# Patient Record
Sex: Male | Born: 1945 | Race: White | Hispanic: No | Marital: Married | Smoking: Former smoker
Health system: Southern US, Community
[De-identification: ages and names within clinical notes are randomized; demographics above are authoritative.]

## PROBLEM LIST (undated history)

## (undated) DIAGNOSIS — K219 Gastro-esophageal reflux disease without esophagitis: Secondary | ICD-10-CM

## (undated) DIAGNOSIS — R350 Frequency of micturition: Secondary | ICD-10-CM

## (undated) DIAGNOSIS — I890 Lymphedema, not elsewhere classified: Secondary | ICD-10-CM

## (undated) DIAGNOSIS — I878 Other specified disorders of veins: Secondary | ICD-10-CM

## (undated) DIAGNOSIS — R42 Dizziness and giddiness: Secondary | ICD-10-CM

## (undated) DIAGNOSIS — K625 Hemorrhage of anus and rectum: Secondary | ICD-10-CM

## (undated) DIAGNOSIS — E785 Hyperlipidemia, unspecified: Secondary | ICD-10-CM

## (undated) DIAGNOSIS — Z972 Presence of dental prosthetic device (complete) (partial): Secondary | ICD-10-CM

## (undated) DIAGNOSIS — A6000 Herpesviral infection of urogenital system, unspecified: Secondary | ICD-10-CM

## (undated) DIAGNOSIS — IMO0001 Reserved for inherently not codable concepts without codable children: Secondary | ICD-10-CM

## (undated) DIAGNOSIS — M1990 Unspecified osteoarthritis, unspecified site: Secondary | ICD-10-CM

## (undated) DIAGNOSIS — G4730 Sleep apnea, unspecified: Secondary | ICD-10-CM

## (undated) DIAGNOSIS — Z789 Other specified health status: Secondary | ICD-10-CM

## (undated) DIAGNOSIS — R972 Elevated prostate specific antigen [PSA]: Secondary | ICD-10-CM

## (undated) DIAGNOSIS — N400 Enlarged prostate without lower urinary tract symptoms: Secondary | ICD-10-CM

## (undated) DIAGNOSIS — I10 Essential (primary) hypertension: Secondary | ICD-10-CM

## (undated) DIAGNOSIS — F419 Anxiety disorder, unspecified: Secondary | ICD-10-CM

## (undated) DIAGNOSIS — Z9889 Other specified postprocedural states: Secondary | ICD-10-CM

## (undated) HISTORY — DX: Hyperlipidemia, unspecified: E78.5

## (undated) HISTORY — DX: Other specified disorders of veins: I87.8

## (undated) HISTORY — DX: Benign prostatic hyperplasia without lower urinary tract symptoms: N40.0

## (undated) HISTORY — DX: Sleep apnea, unspecified: G47.30

## (undated) HISTORY — DX: Elevated prostate specific antigen (PSA): R97.20

## (undated) HISTORY — DX: Herpesviral infection of urogenital system, unspecified: A60.00

## (undated) HISTORY — DX: Gastro-esophageal reflux disease without esophagitis: K21.9

## (undated) HISTORY — DX: Other specified postprocedural states: Z98.890

## (undated) HISTORY — DX: Lymphedema, not elsewhere classified: I89.0

## (undated) HISTORY — PX: VEIN SURGERY: SHX48

## (undated) HISTORY — DX: Unspecified osteoarthritis, unspecified site: M19.90

## (undated) HISTORY — DX: Hemorrhage of anus and rectum: K62.5

## (undated) HISTORY — DX: Frequency of micturition: R35.0

## (undated) HISTORY — DX: Essential (primary) hypertension: I10

## (undated) HISTORY — PX: COLONOSCOPY: SHX174

---

## 2000-05-19 DIAGNOSIS — Z9889 Other specified postprocedural states: Secondary | ICD-10-CM

## 2000-05-19 HISTORY — DX: Other specified postprocedural states: Z98.890

## 2002-05-19 HISTORY — PX: LIPOMA EXCISION: SHX5283

## 2006-03-17 ENCOUNTER — Encounter
Admission: RE | Admit: 2006-03-17 | Discharge: 2006-03-17 | Payer: Self-pay | Admitting: Physical Medicine and Rehabilitation

## 2008-02-09 ENCOUNTER — Emergency Department (HOSPITAL_COMMUNITY): Admission: EM | Admit: 2008-02-09 | Discharge: 2008-02-09 | Payer: Self-pay | Admitting: Emergency Medicine

## 2008-03-06 ENCOUNTER — Ambulatory Visit: Payer: Self-pay | Admitting: Family Medicine

## 2008-10-12 ENCOUNTER — Encounter: Payer: Self-pay | Admitting: Internal Medicine

## 2008-10-17 ENCOUNTER — Encounter: Payer: Self-pay | Admitting: Internal Medicine

## 2009-07-04 ENCOUNTER — Ambulatory Visit: Payer: Self-pay | Admitting: Gastroenterology

## 2010-10-31 ENCOUNTER — Encounter (HOSPITAL_BASED_OUTPATIENT_CLINIC_OR_DEPARTMENT_OTHER): Payer: Medicare Other | Attending: Internal Medicine

## 2010-10-31 DIAGNOSIS — IMO0001 Reserved for inherently not codable concepts without codable children: Secondary | ICD-10-CM | POA: Insufficient documentation

## 2010-10-31 DIAGNOSIS — E7800 Pure hypercholesterolemia, unspecified: Secondary | ICD-10-CM | POA: Insufficient documentation

## 2010-10-31 DIAGNOSIS — L98499 Non-pressure chronic ulcer of skin of other sites with unspecified severity: Secondary | ICD-10-CM | POA: Insufficient documentation

## 2010-10-31 DIAGNOSIS — E119 Type 2 diabetes mellitus without complications: Secondary | ICD-10-CM | POA: Insufficient documentation

## 2010-10-31 DIAGNOSIS — K219 Gastro-esophageal reflux disease without esophagitis: Secondary | ICD-10-CM | POA: Insufficient documentation

## 2010-10-31 DIAGNOSIS — I739 Peripheral vascular disease, unspecified: Secondary | ICD-10-CM | POA: Insufficient documentation

## 2010-10-31 DIAGNOSIS — L988 Other specified disorders of the skin and subcutaneous tissue: Secondary | ICD-10-CM | POA: Insufficient documentation

## 2010-10-31 DIAGNOSIS — I872 Venous insufficiency (chronic) (peripheral): Secondary | ICD-10-CM | POA: Insufficient documentation

## 2010-10-31 DIAGNOSIS — I10 Essential (primary) hypertension: Secondary | ICD-10-CM | POA: Insufficient documentation

## 2010-10-31 DIAGNOSIS — I890 Lymphedema, not elsewhere classified: Secondary | ICD-10-CM | POA: Insufficient documentation

## 2010-11-01 NOTE — Assessment & Plan Note (Unsigned)
[facility]  NAME:  Brett Lam, Brett Lam NO.:  [id number]  MEDICAL RECORD NO.:  [id number]      DATE OF BIRTH:  Jun 13, 1945  PHYSICIAN:  Maxwell Caul, M.D. VISIT DATE:  10/31/2010                                  OFFICE VISIT   Brett Lam is a 65 year old man who is referred by his physician at the [facility] in [place] for our review of chronic skin changes on his bilateral buttocks, which have been painful.  The history is a bit difficult to obtain.  Actually the patient came in and started discussing his chronic venous stasis changes in his bilateral legs.  It was only when I looked at the referral sheet that I saw the reason that he was actually here and I was already part way through my review.  In any case, he has had on his bilateral buttocks a painful area for several years.  His doctor notes that these are chronic lymphedematous/elephantiasis skin changes.  We looked at that these issues.  He has not had any surgery in the area.  He has not had any infections.  He does not have any recent rectal bleeding, although I note he had a flex sig in 2002 for rectal bleeding and had polyps on a colonoscopy in 2011.  He was also seen at [facility] in June 2011, although I do not have these records.  PAST MEDICAL HISTORY: 1. Peripheral vascular disease. 2. Chronic venous stasis with a history of venous stasis ulcerations. 3. Hypertension. 4. Type 2 diabetes. 5. Gastroesophageal reflux. 6. Hypercholesterolemia. 7. Osteoarthritis. 8. "Perirectal lymphedema." 9. Sleep apnea for which he uses BiPAP.  Medication list is reviewed.  The patient's lower extremities were examined initially.  He had peripheral pulses intact.  He has chronic venous stasis, but no active wounds.  He tells me that he does not wear graded pressure stockings, has tried them in the past, and found them not to be to his  liking. Skin, on his bilateral buttocks its superior aspect in a mirror image he has hypertrophied skin with almost a cobblestone appearance.  There was no subcutaneous involvement here.  These were not painful.  I really do not have a good idea of what has caused this.  If he had, had surgery in the area, I might have wondered about keloids.  I have never seen chronic lymphedema look like this.  Between the clefts of his buttocks in the coccyx area, there is a linear ulceration and I suspect this is where most of the discomfort is coming from.  I think this is from friction in the area.  IMPRESSION: 1. Linear ulceration on the coccyx.  We applied collagen to this.  I     think if he keeps this clean, dry, and the cleft separated, I think     this will heal fairly easily. 2. Primary dermatologic areas on his bilateral buttocks as described     above.  This has a cobblestone appearance; however, the areas are     fleshy, not particularly tender.  There is no evidence of an     infection here.  He has been to see Dermatology at [facility] and I would  be interested in what they diagnosed him with.  I have never seen     lymphedema look like this.  I would be reluctant to attempt     surgical debridement of this area and I think this is why he was     referred here less we leave him with a more difficult situation     than he is in now.  I would refer him back to Dermatology for an     exact diagnosis if one is not available, I would wonder whether     this actually needs to be punch biopsied.  I did not think that we could be helpful further in this gentleman's care and I have discharged him back to the care of his primary physician with regards to the pain in the area.  I think this is most likely coming from the linear ulceration deep within the cleft of his buttocks in the coccyx area.  I think this would respond easily to simple measures as noted above.           ______________________________ Maxwell Caul, M.D.     MGR/MEDQ  D:  10/31/2010  T:  11/01/2010  Job:  [id number]

## 2010-11-28 ENCOUNTER — Encounter (HOSPITAL_BASED_OUTPATIENT_CLINIC_OR_DEPARTMENT_OTHER): Payer: Medicare Other | Attending: Internal Medicine

## 2010-11-28 DIAGNOSIS — I739 Peripheral vascular disease, unspecified: Secondary | ICD-10-CM | POA: Insufficient documentation

## 2010-11-28 DIAGNOSIS — I890 Lymphedema, not elsewhere classified: Secondary | ICD-10-CM | POA: Insufficient documentation

## 2010-11-28 DIAGNOSIS — I872 Venous insufficiency (chronic) (peripheral): Secondary | ICD-10-CM | POA: Insufficient documentation

## 2010-11-28 DIAGNOSIS — I10 Essential (primary) hypertension: Secondary | ICD-10-CM | POA: Insufficient documentation

## 2010-11-28 DIAGNOSIS — K219 Gastro-esophageal reflux disease without esophagitis: Secondary | ICD-10-CM | POA: Insufficient documentation

## 2010-11-28 DIAGNOSIS — E7800 Pure hypercholesterolemia, unspecified: Secondary | ICD-10-CM | POA: Insufficient documentation

## 2010-11-28 DIAGNOSIS — L988 Other specified disorders of the skin and subcutaneous tissue: Secondary | ICD-10-CM | POA: Insufficient documentation

## 2010-11-28 DIAGNOSIS — E119 Type 2 diabetes mellitus without complications: Secondary | ICD-10-CM | POA: Insufficient documentation

## 2010-11-28 DIAGNOSIS — L98499 Non-pressure chronic ulcer of skin of other sites with unspecified severity: Secondary | ICD-10-CM | POA: Insufficient documentation

## 2010-11-28 DIAGNOSIS — IMO0001 Reserved for inherently not codable concepts without codable children: Secondary | ICD-10-CM | POA: Insufficient documentation

## 2011-02-17 LAB — DIFFERENTIAL
Basophils Absolute: 0
Basophils Relative: 0
Eosinophils Absolute: 0.1
Eosinophils Relative: 1
Lymphocytes Relative: 23
Lymphs Abs: 2.3
Monocytes Absolute: 1.3 — ABNORMAL HIGH
Monocytes Relative: 13 — ABNORMAL HIGH
Neutro Abs: 6.3
Neutrophils Relative %: 63

## 2011-02-17 LAB — URINALYSIS, ROUTINE W REFLEX MICROSCOPIC
Bilirubin Urine: NEGATIVE
Glucose, UA: NEGATIVE
Ketones, ur: NEGATIVE
Leukocytes, UA: NEGATIVE
Nitrite: NEGATIVE
Protein, ur: NEGATIVE
Specific Gravity, Urine: 1.01
Urobilinogen, UA: 0.2
pH: 5.5

## 2011-02-17 LAB — BASIC METABOLIC PANEL
BUN: 12
CO2: 33 — ABNORMAL HIGH
Calcium: 9.6
Chloride: 99
Creatinine, Ser: 1.1
GFR calc Af Amer: >60
GFR calc non Af Amer: >60
Glucose, Bld: 177 — ABNORMAL HIGH
Potassium: 3.2 — ABNORMAL LOW
Sodium: 140

## 2011-02-17 LAB — CBC
HCT: 45.8
Hemoglobin: 15.5
MCHC: 33.8
MCV: 87.6
Platelets: 208
RBC: 5.23
RDW: 13
WBC: 10.1

## 2011-02-17 LAB — URINE MICROSCOPIC-ADD ON

## 2012-09-28 ENCOUNTER — Ambulatory Visit (INDEPENDENT_AMBULATORY_CARE_PROVIDER_SITE_OTHER): Payer: Medicare Other | Admitting: Cardiovascular Disease

## 2012-09-28 ENCOUNTER — Encounter: Payer: Self-pay | Admitting: Cardiovascular Disease

## 2012-09-28 VITALS — BP 113/69 | HR 63 | Ht 67.0 in | Wt 356.0 lb

## 2012-09-28 DIAGNOSIS — I10 Essential (primary) hypertension: Secondary | ICD-10-CM

## 2012-09-28 DIAGNOSIS — R0789 Other chest pain: Secondary | ICD-10-CM

## 2012-09-28 DIAGNOSIS — G4730 Sleep apnea, unspecified: Secondary | ICD-10-CM

## 2012-09-28 DIAGNOSIS — R0602 Shortness of breath: Secondary | ICD-10-CM

## 2012-09-28 DIAGNOSIS — E785 Hyperlipidemia, unspecified: Secondary | ICD-10-CM | POA: Insufficient documentation

## 2012-09-28 DIAGNOSIS — E7849 Other hyperlipidemia: Secondary | ICD-10-CM | POA: Insufficient documentation

## 2012-09-28 DIAGNOSIS — R079 Chest pain, unspecified: Secondary | ICD-10-CM

## 2012-09-28 NOTE — Assessment & Plan Note (Signed)
BP is OK.  I have advised him to watch his diet and to lose weight

## 2012-09-28 NOTE — Assessment & Plan Note (Signed)
Brett Lam presents for symptoms of exertional CP / tightness.  He has several risk factors for coronary artery disease including hypertension, hyperlipidemia, diabetes mellitus, and morbid obesity. His symptoms are disturbing I think it we should proceed with a stress Myoview study. His EKG is nonacute.  We will need to do the study as a 2 day protocol because of his weight.  I will see him again in 2 months.  He is seeing a weight loss doctor to help with his weight issues.  I cautioned him about eating any salt and fast foods.

## 2012-09-28 NOTE — Patient Instructions (Addendum)
Your physician wants you to follow-up in: 2 months with Dr. Elease Hashimoto. You will receive a reminder letter in the mail two months in advance. If you don't receive a letter, please call our office to schedule the follow-up appointment.   [facility] MYOVIEW  Your caregiver has ordered a Stress Test with nuclear imaging. The purpose of this test is to evaluate the blood supply to your heart muscle. This procedure is referred to as a "Non-Invasive Stress Test." This is because other than having an IV started in your vein, nothing is inserted or "invades" your body. Cardiac stress tests are done to find areas of poor blood flow to the heart by determining the extent of coronary artery disease (CAD). Some patients exercise on a treadmill, which naturally increases the blood flow to your heart, while others who are  unable to walk on a treadmill due to physical limitations have a pharmacologic/chemical stress agent called Lexiscan . This medicine will mimic walking on a treadmill by temporarily increasing your coronary blood flow.   Please note: these test may take anywhere between 2-4 hours to complete  PLEASE REPORT TO [facility] MEDICAL MALL ENTRANCE  THE VOLUNTEERS AT THE FIRST DESK WILL DIRECT YOU WHERE TO GO  Date of Procedure:_________5/21/14 and 5/22/14____________________________  Arrival Time for Procedure:_______________7:45 am both days_______________  Instructions regarding medication:   __x__ : Hold diabetes medication morning of procedure  __x__:  Hold betablocker(s) night before procedure and morning of procedure (atenolol)  ____:  Hold other medications as follows:_________________________________________________________________________________________________________________________________________________________________________________________________________________________________________________________________________________________  PLEASE NOTIFY THE OFFICE AT LEAST 24 HOURS IN ADVANCE IF  YOU ARE UNABLE TO KEEP YOUR APPOINTMENT.  [phone]  How to prepare for your Myoview test:  1. Do not eat or drink after midnight 2. No caffeine for 24 hours prior to test 3. Your medication may be taken with water.  If your doctor stopped a medication because of this test, do not take that medication. 4. Ladies, please do not wear dresses.  Skirts or pants are appropriate. Please wear a short sleeve shirt. 5. No perfume, cologne or lotion. 6. Wear comfortable walking shoes. No heels!
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
## 2012-10-06 ENCOUNTER — Ambulatory Visit: Payer: Self-pay

## 2012-10-06 ENCOUNTER — Telehealth: Payer: Self-pay

## 2012-10-06 NOTE — Telephone Encounter (Signed)
fyi

## 2012-10-06 NOTE — Telephone Encounter (Signed)
Wanted to let us know pt did not show today for his part 1 of 2 stress test.

## 2012-12-14 ENCOUNTER — Ambulatory Visit: Payer: Medicare Other | Admitting: Cardiovascular Disease

## 2013-11-28 LAB — PSA: PSA: 11.6

## 2014-10-05 ENCOUNTER — Other Ambulatory Visit: Payer: Self-pay | Admitting: Specialist

## 2014-10-17 ENCOUNTER — Ambulatory Visit
Admission: RE | Admit: 2014-10-17 | Discharge: 2014-10-17 | Disposition: A | Discharge status: Home / Self Care | Payer: Medicare HMO | Source: Ambulatory Visit | Attending: Specialist | Admitting: Specialist

## 2014-11-15 ENCOUNTER — Ambulatory Visit: Payer: Self-pay

## 2014-11-17 ENCOUNTER — Ambulatory Visit: Payer: Self-pay | Admitting: Urology

## 2014-12-01 ENCOUNTER — Ambulatory Visit (INDEPENDENT_AMBULATORY_CARE_PROVIDER_SITE_OTHER): Payer: Medicare HMO | Admitting: Urology

## 2014-12-01 ENCOUNTER — Encounter: Payer: Self-pay | Admitting: Urology

## 2014-12-01 VITALS — BP 117/75 | HR 62 | Ht 67.0 in | Wt 343.9 lb

## 2014-12-01 DIAGNOSIS — R339 Retention of urine, unspecified: Secondary | ICD-10-CM

## 2014-12-01 DIAGNOSIS — R972 Elevated prostate specific antigen [PSA]: Secondary | ICD-10-CM | POA: Diagnosis not present

## 2014-12-01 DIAGNOSIS — N400 Enlarged prostate without lower urinary tract symptoms: Secondary | ICD-10-CM | POA: Diagnosis not present

## 2014-12-01 DIAGNOSIS — R350 Frequency of micturition: Secondary | ICD-10-CM

## 2014-12-01 LAB — URINALYSIS, COMPLETE
Bilirubin, UA: NEGATIVE
Glucose, UA: NEGATIVE
Ketones, UA: NEGATIVE
Leukocytes, UA: NEGATIVE
Nitrite, UA: NEGATIVE
Protein, UA: NEGATIVE
Specific Gravity, UA: 1.02 (ref 1.005–1.030)
Urobilinogen, Ur: 0.2 mg/dL (ref 0.2–1.0)
pH, UA: 7 (ref 5.0–7.5)

## 2014-12-01 LAB — BLADDER SCAN AMB NON-IMAGING

## 2014-12-01 LAB — MICROSCOPIC EXAMINATION: Bacteria, UA: NONE SEEN

## 2014-12-01 NOTE — Progress Notes (Signed)
12/01/2014 12:14 PM   Brett Lam December 19, 1945 [id number]  Referring provider: Hillery Aldo, MD [address] [place], [place] [address]  Chief Complaint  Patient presents with  . Elevated PSA    52month w/PSA    HPI: 69 yo M with multiple GU issues including history of elevated PSA to 11.6 s/p negative biopsy 12/2013, TRUS vol 50 cc, and BPH/ OAB. He returns today four routine follow up.   He also has severe urinary urgency/ urge incontinence.  He gets up 4x nightly to void.  He does drink liquids during the day though tout the late evening.  He also compains of cotton mouth and drinks a good amount of water/ fluids as a result.  He does use biotin for drymouth which helps.  He has taken oxybutynin in the past without any improvement.     He has a history of poorly controlled DM but is recently improving from 11 to 7.9  most recently  In 07/2014.  He does also have a history of diabetic neuropathy.     He also has OSA and wears his CPAP regularly.   He is currently taking Flomax 0.4 mg which he has been taking for several years. He was also started on finasteride by his PCP on 08/2014 but he is not sure if he his taking this medication and this is not on his hand written medications lists.    He is scheduled for bariatric surgery in the near future.    PVR today 176 accuracy is somewhat questionable given patient's habitus.      IPSS      12/01/14 1200       International Prostate Symptom Score   How often have you had the sensation of not emptying your bladder? Less than 1 in 5     How often have you had to urinate less than every two hours? Almost always     How often have you found you stopped and started again several times when you urinated? Less than 1 in 5 times     How often have you found it difficult to postpone urination? Almost always     How often have you had a weak urinary stream? Less than 1 in 5 times     How often have you had to strain to start  urination? Not at All     How many times did you typically get up at night to urinate? 4 Times     Total IPSS Score 17     Quality of Life due to urinary symptoms   If you were to spend the rest of your life with your urinary condition just the way it is now how would you feel about that? Terrible           PMH: Past Medical History  Diagnosis Date  . Diabetes mellitus without complication   . Sleep apnea   . GERD (gastroesophageal reflux disease)   . Hyperlipidemia   . Osteoarthritis   . H/O flexible sigmoidoscopy 2002    rectal bleeding   . Genital herpes   . Elephantiasis   . Venous stasis   . Rectal bleed   . Elevated PSA   . Urinary frequency   . Enlarged prostate     Surgical History: Past Surgical History  Procedure Laterality Date  . Colonoscopy    . Lipoma excision  2004  . Vein surgery    . Prostate surgery  Home Medications:    Medication List       This list is accurate as of: 12/01/14 12:14 PM.  Always use your most recent med list.               atenolol 100 MG tablet  Commonly known as:  TENORMIN  Take 100 mg by mouth daily.     BYETTA 10 MCG PEN 10 MCG/0.04ML Sopn injection  Generic drug:  exenatide     doxazosin 4 MG tablet  Commonly known as:  CARDURA  Take 4 mg by mouth daily.     glipiZIDE 10 MG 24 hr tablet  Commonly known as:  GLUCOTROL XL  Take 10 mg by mouth daily.     ibuprofen 800 MG tablet  Commonly known as:  ADVIL,MOTRIN     linagliptin 5 MG Tabs tablet  Commonly known as:  TRADJENTA  Take by mouth.     lisinopril-hydrochlorothiazide 20-12.5 MG per tablet  Commonly known as:  PRINZIDE,ZESTORETIC  Take 2 tablets by mouth 2 (two) times daily.     lovastatin 20 MG tablet  Commonly known as:  MEVACOR     meloxicam 15 MG tablet  Commonly known as:  MOBIC     metFORMIN 500 MG tablet  Commonly known as:  GLUCOPHAGE  Take 500 mg by mouth daily with breakfast.     PEN NEEDLES 31GX5/16" 31G X 8 MM Misc      sertraline 100 MG tablet  Commonly known as:  ZOLOFT     simvastatin 20 MG tablet  Commonly known as:  ZOCOR  Take 20 mg by mouth daily.     tamsulosin 0.4 MG Caps capsule  Commonly known as:  FLOMAX        Allergies: No Known Allergies  Family History: Family History  Problem Relation Age of Onset  . Hypertension Other     Social History:  reports that he has quit smoking. His smoking use included Cigarettes. He has a 6 pack-year smoking history. He does not have any smokeless tobacco history on file. He reports that he does not drink alcohol or use illicit drugs.   Review of Systems UROLOGY Frequent Urination?: Yes Hard to postpone urination?: Yes Burning/pain with urination?: No Get up at night to urinate?: Yes Leakage of urine?: No Urine stream starts and stops?: No Trouble starting stream?: Yes Do you have to strain to urinate?: No Blood in urine?: No Urinary tract infection?: No Sexually transmitted disease?: No Injury to kidneys or bladder?: No Painful intercourse?: No Weak stream?: No Erection problems?: No Penile pain?: No Gastrointestinal Nausea?: No Vomiting?: No Indigestion/heartburn?: No Diarrhea?: No Constipation?: No Constitutional Fever: No Night sweats?: No Weight loss?: No Fatigue?: Yes Skin Skin rash/lesions?: No Itching?: No Eyes Blurred vision?: No Double vision?: No Ears/Nose/Throat Sore throat?: No Sinus problems?: Yes Hematologic/Lymphatic Swollen glands?: No Easy bruising?: Yes Cardiovascular Leg swelling?: Yes Chest pain?: No Respiratory Cough?: No Shortness of breath?: Yes Endocrine Excessive thirst?: Yes Musculoskeletal Back pain?: Yes Joint pain?: Yes Neurological Headaches?: No Dizziness?: No Psychologic Depression?: No Anxiety?: No    Physical Exam: BP 117/75 mmHg  Pulse 62  Ht 5\' 7"  (1.702 m)  Wt 343 lb 14.4 oz (155.992 kg)  BMI 53.85 kg/m2  Constitutional:  Alert and oriented, No acute distress.   Over the obese. HEENT: [place] AT, moist mucus membranes.  Trachea midline, no masses.  Cardiovascular: No clubbing, cyanosis, or edema. Respiratory: Normal respiratory effort, no increased work of breathing. GI:  Abdomen is soft, nontender, nondistended, no abdominal masses GU: No CVA tenderness. Rectal exam difficult to patient's habitus, able to palpate the apex of the gland which was normal. Skin: No rashes, bruises or suspicious lesions. Neurologic: Grossly intact, no focal deficits, moving all 4 extremities. Psychiatric: Normal mood and affect.  Laboratory Data: Cr 0.95 on 07/2014   Lab Results  Component Value Date   PSA 11.6 11/28/2013   Urinalysis Results for orders placed or performed in visit on 12/01/14  Microscopic Examination  Result Value Ref Range   WBC, UA 0-5 0 -  5 /hpf   RBC, UA 0-2 0 -  2 /hpf   Epithelial Cells (non renal) 0-10 0 - 10 /hpf   Bacteria, UA None seen None seen/Few  PSA  Result Value Ref Range   Prostate Specific Ag, Serum 7.8 (H) 0.0 - 4.0 ng/mL  Urinalysis, Complete  Result Value Ref Range   Specific Gravity, UA 1.020 1.005 - 1.030   pH, UA 7.0 5.0 - 7.5   Color, UA Yellow Yellow   Appearance Ur Clear Clear   Leukocytes, UA Negative Negative   Protein, UA Negative Negative/Trace   Glucose, UA Negative Negative   Ketones, UA Negative Negative   RBC, UA Trace (A) Negative   Bilirubin, UA Negative Negative   Urobilinogen, Ur 0.2 0.2 - 1.0 mg/dL   Nitrite, UA Negative Negative   Microscopic Examination See below:   BLADDER SCAN AMB NON-IMAGING  Result Value Ref Range   Scan Result      Pertinent Imaging: n/a  Assessment & Plan: 69 year old male who is morbidly obese with diabetes and other comorbidities who presents today for follow-up on elevated PSA and severe OAB symptoms. We again discussed in detail that his symptoms are likely multifactorial related to BPH, overactivity from poorly controlled diabetes, polydipsia, and sleep  apnea.  I suspect his symptoms may improve with weight loss and better controlled diabetes following his upcoming gastric bypass surgery.  1. Elevated PSA Repeat PSA down to 7.8 today from 11.8 at the time of negative biopsy in 8 2015. We'll continue to monitor. Recommend repeat PSA/DRE in 6 months. - PSA - Urinalysis, Complete  2. BPH (benign prostatic hyperplasia) Continue Flomax. It does not appear the patient is currently taking finasteride, TRUS vol only 50 cc.  I do not see an indication for finasteride as his symptoms are primarily irritative, nonobstructive. - BLADDER SCAN AMB NON-IMAGING  3. Urinary frequency As above.  We discussed the addition of added an additional anticholinergic medication, however, the patient would prefer to abstain at this time. Again I suspect improvement with with loss.  4. Incomplete bladder emptying Post residual today slightly elevated, however, I suspect this is not an accurate volume due to patient's habitus.   Return in about 6 months (around 06/03/2015) for PSA, DRE, IPSS .  Vanna Scotland, MD  [facility] [address] [place], [place] [address] [phone]

## 2014-12-02 LAB — PSA: Prostate Specific Ag, Serum: 7.8 ng/mL — ABNORMAL HIGH (ref 0.0–4.0)

## 2015-01-23 ENCOUNTER — Other Ambulatory Visit: Payer: Medicare Other

## 2015-01-26 ENCOUNTER — Encounter: Payer: Self-pay | Admitting: *Deleted

## 2015-01-26 ENCOUNTER — Inpatient Hospital Stay: Admission: RE | Admit: 2015-01-26 | Payer: Medicare HMO | Source: Ambulatory Visit

## 2015-01-26 NOTE — Patient Instructions (Addendum)
  Your procedure is scheduled on: 02-06-15 Report to MEDICAL MALL SAME DAY SURGERY DESK 2ND FLOOR To find out your arrival time please call [phone] between 1PM - 3PM on 02-05-15 [facility])  Remember: Instructions that are not followed completely may result in serious medical risk, up to and including death, or upon the discretion of your surgeon and anesthesiologist your surgery may need to be rescheduled.    _X___ 1. Do not eat food or drink liquids after midnight. No gum chewing or hard candies.     _X___ 2. No Alcohol for 24 hours before or after surgery.   ____ 3. Bring all medications with you on the day of surgery if instructed.    _X___ 4. Notify your doctor if there is any change in your medical condition     (cold, fever, infections).     Do not wear jewelry, make-up, hairpins, clips or nail polish.  Do not wear lotions, powders, or perfumes. You may wear deodorant.  Do not shave 48 hours prior to surgery. Men may shave face and neck.  Do not bring valuables to the hospital.    [facility] is not responsible for any belongings or valuables.               Contacts, dentures or bridgework may not be worn into surgery.  Leave your suitcase in the car. After surgery it may be brought to your room.  For patients admitted to the hospital, discharge time is determined by your  treatment team.   Patients discharged the day of surgery will not be allowed to drive home.   Please read over the following fact sheets that you were given:     _X___ Take these medicines the morning of surgery with A SIP OF WATER:    1. FLOMAX (TAMSULOSIN)  2.   3.   4.  5.  6.  ____ Fleet Enema (as directed)   _X___ Use CHG Soap as directed  ____ Use inhalers on the day of surgery  ____ Stop metformin 2 days prior to surgery    ____ Take 1/2 of usual insulin dose the night before surgery and none on the morning of surgery.   ____ Stop Coumadin/Plavix/aspirin-N/A  __X__ Stop  Anti-inflammatories-STOP IBUPROFEN 7 DAYS PRIOR TO SURGERY-NO NSAIDS OR ASPIRIN PRODUCTS-TYLENOL OK   ____ Stop supplements until after surgery.    __X__ Bring C-Pap to the hospital.

## 2015-01-30 ENCOUNTER — Encounter
Admission: RE | Admit: 2015-01-30 | Discharge: 2015-01-30 | Disposition: A | Discharge status: Home / Self Care | Payer: Medicare HMO | Source: Ambulatory Visit | Attending: Anesthesiology | Admitting: Anesthesiology

## 2015-01-30 DIAGNOSIS — Z01812 Encounter for preprocedural laboratory examination: Secondary | ICD-10-CM | POA: Diagnosis present

## 2015-01-30 LAB — POTASSIUM: Potassium: 3.2 mmol/L — ABNORMAL LOW (ref 3.5–5.1)

## 2015-01-30 NOTE — Pre-Procedure Instructions (Signed)
Spoke with Tiffany at Dr Zigmund Gottron office and notified her of pts low K+3.2.  Faxed result to their office also. She said she would make a note that I called with low K+ result.

## 2015-02-06 ENCOUNTER — Inpatient Hospital Stay: Payer: Medicare HMO | Admitting: Anesthesiology

## 2015-02-06 ENCOUNTER — Encounter: Payer: Self-pay | Admitting: *Deleted

## 2015-02-06 ENCOUNTER — Inpatient Hospital Stay
Admission: AD | Admit: 2015-02-06 | Discharge: 2015-02-08 | DRG: 621 | Disposition: A | Discharge status: Home / Self Care | Payer: Medicare HMO | Source: Ambulatory Visit | Attending: Specialist | Admitting: Specialist

## 2015-02-06 ENCOUNTER — Encounter
Admission: AD | Disposition: A | Discharge status: Home / Self Care | Payer: Self-pay | Source: Ambulatory Visit | Attending: Specialist

## 2015-02-06 DIAGNOSIS — Z87891 Personal history of nicotine dependence: Secondary | ICD-10-CM

## 2015-02-06 DIAGNOSIS — G4733 Obstructive sleep apnea (adult) (pediatric): Secondary | ICD-10-CM | POA: Diagnosis not present

## 2015-02-06 DIAGNOSIS — Z79899 Other long term (current) drug therapy: Secondary | ICD-10-CM

## 2015-02-06 DIAGNOSIS — I10 Essential (primary) hypertension: Secondary | ICD-10-CM | POA: Diagnosis present

## 2015-02-06 DIAGNOSIS — K219 Gastro-esophageal reflux disease without esophagitis: Secondary | ICD-10-CM | POA: Diagnosis present

## 2015-02-06 DIAGNOSIS — M069 Rheumatoid arthritis, unspecified: Secondary | ICD-10-CM | POA: Diagnosis not present

## 2015-02-06 DIAGNOSIS — Z6841 Body Mass Index (BMI) 40.0 and over, adult: Secondary | ICD-10-CM

## 2015-02-06 DIAGNOSIS — K449 Diaphragmatic hernia without obstruction or gangrene: Secondary | ICD-10-CM | POA: Diagnosis present

## 2015-02-06 HISTORY — DX: Anxiety disorder, unspecified: F41.9

## 2015-02-06 HISTORY — PX: LAPAROSCOPIC GASTRIC RESTRICTIVE DUODENAL PROCEDURE (DUODENAL SWITCH): SHX6667

## 2015-02-06 HISTORY — DX: Reserved for inherently not codable concepts without codable children: IMO0001

## 2015-02-06 HISTORY — DX: Other specified health status: Z78.9

## 2015-02-06 LAB — CREATININE, SERUM
Creatinine, Ser: 1.17 mg/dL (ref 0.61–1.24)
GFR calc Af Amer: >60 mL/min (ref >60)
GFR calc non Af Amer: >60 mL/min (ref >60)

## 2015-02-06 LAB — CBC
HCT: 41.5 % (ref 40.0–52.0)
Hemoglobin: 13.8 g/dL (ref 13.0–18.0)
MCH: 29 pg (ref 26.0–34.0)
MCHC: 33.3 g/dL (ref 32.0–36.0)
MCV: 87.2 fL (ref 80.0–100.0)
Platelets: 181 10*3/uL (ref 150–440)
RBC: 4.76 MIL/uL (ref 4.40–5.90)
RDW: 13.5 % (ref 11.5–14.5)
WBC: 15.8 10*3/uL — ABNORMAL HIGH (ref 3.8–10.6)

## 2015-02-06 LAB — GLUCOSE, CAPILLARY
Glucose-Capillary: 88 mg/dL (ref 65–99)
Glucose-Capillary: 171 mg/dL — ABNORMAL HIGH (ref 65–99)

## 2015-02-06 LAB — POCT I-STAT 4, (NA,K, GLUC, HGB,HCT)
Glucose, Bld: 92 mg/dL (ref 65–99)
HCT: 43 % (ref 39.0–52.0)
Hemoglobin: 14.6 g/dL (ref 13.0–17.0)
Potassium: 3.4 mmol/L — ABNORMAL LOW (ref 3.5–5.1)
Sodium: 142 mmol/L (ref 135–145)

## 2015-02-06 LAB — POTASSIUM: Potassium: 3.4 mmol/L

## 2015-02-06 SURGERY — LAPAROSCOPIC GASTRIC RESTRICTIVE DUODENAL PROCEDURE (DUODENAL SWITCH)
Anesthesia: General | Wound class: Clean Contaminated

## 2015-02-06 MED ORDER — ACETAMINOPHEN 160 MG/5ML PO SOLN: 650.0000 mg | ORAL | Status: DC | PRN

## 2015-02-06 MED ORDER — ONDANSETRON 4 MG PO TBDP: 4.0000 mg | ORAL_TABLET | Freq: Three times a day (TID) | ORAL | Status: DC | PRN

## 2015-02-06 MED ORDER — NEOSTIGMINE METHYLSULFATE 10 MG/10ML IV SOLN
INTRAVENOUS | Status: DC | PRN
  Administered 2015-02-06: 5 mg via INTRAVENOUS

## 2015-02-06 MED ORDER — SODIUM CHLORIDE 0.9 % IR SOLN
Status: DC | PRN
  Administered 2015-02-06: 400 mL

## 2015-02-06 MED ORDER — PHENYLEPHRINE HCL 10 MG/ML IJ SOLN
INTRAMUSCULAR | Status: DC | PRN
  Administered 2015-02-06: 100 ug via INTRAVENOUS

## 2015-02-06 MED ORDER — ENOXAPARIN SODIUM 40 MG/0.4ML ~~LOC~~ SOLN
40.0000 mg | Freq: Two times a day (BID) | SUBCUTANEOUS | Status: DC
  Administered 2015-02-07 – 2015-02-08 (×3): 40 mg via SUBCUTANEOUS
  Filled 2015-02-06 (×3): qty 0.4

## 2015-02-06 MED ORDER — PROPOFOL 10 MG/ML IV BOLUS
INTRAVENOUS | Status: DC | PRN
  Administered 2015-02-06: 50 mg via INTRAVENOUS
  Administered 2015-02-06: 200 mg via INTRAVENOUS

## 2015-02-06 MED ORDER — GLYCOPYRROLATE 0.2 MG/ML IJ SOLN
INTRAMUSCULAR | Status: DC | PRN
  Administered 2015-02-06: 1 mg via INTRAVENOUS

## 2015-02-06 MED ORDER — CEFOXITIN SODIUM 2 G IV SOLR
2.0000 g | Freq: Three times a day (TID) | INTRAVENOUS | Status: AC
  Administered 2015-02-06 – 2015-02-07 (×2): 2 g via INTRAVENOUS
  Filled 2015-02-06 (×2): qty 2

## 2015-02-06 MED ORDER — ACETAMINOPHEN 10 MG/ML IV SOLN
INTRAVENOUS | Status: AC
  Administered 2015-02-06: 1000 mg via INTRAVENOUS
  Filled 2015-02-06: qty 100

## 2015-02-06 MED ORDER — FENTANYL CITRATE (PF) 100 MCG/2ML IJ SOLN
25.0000 ug | INTRAMUSCULAR | Status: DC | PRN
  Administered 2015-02-06 (×4): 25 ug via INTRAVENOUS

## 2015-02-06 MED ORDER — SODIUM CHLORIDE 0.9 % IV SOLN
INTRAVENOUS | Status: DC
  Administered 2015-02-06 – 2015-02-08 (×4): via INTRAVENOUS

## 2015-02-06 MED ORDER — INFLUENZA VAC SPLIT QUAD 0.5 ML IM SUSY
0.5000 mL | PREFILLED_SYRINGE | INTRAMUSCULAR | Status: AC
  Administered 2015-02-07: 0.5 mL via INTRAMUSCULAR
  Filled 2015-02-06: qty 0.5

## 2015-02-06 MED ORDER — ONDANSETRON HCL 4 MG/2ML IJ SOLN
INTRAMUSCULAR | Status: DC | PRN
  Administered 2015-02-06: 4 mg via INTRAVENOUS

## 2015-02-06 MED ORDER — MIDAZOLAM HCL 2 MG/2ML IJ SOLN
INTRAMUSCULAR | Status: DC | PRN
  Administered 2015-02-06: 2 mg via INTRAVENOUS

## 2015-02-06 MED ORDER — ENOXAPARIN SODIUM 40 MG/0.4ML ~~LOC~~ SOLN: 40.0000 mg | SUBCUTANEOUS | Status: DC

## 2015-02-06 MED ORDER — ONDANSETRON HCL 4 MG/2ML IJ SOLN: 4.0000 mg | Freq: Once | INTRAMUSCULAR | Status: DC | PRN

## 2015-02-06 MED ORDER — ROCURONIUM BROMIDE 100 MG/10ML IV SOLN
INTRAVENOUS | Status: DC | PRN
  Administered 2015-02-06: 10 mg via INTRAVENOUS

## 2015-02-06 MED ORDER — FENTANYL CITRATE (PF) 100 MCG/2ML IJ SOLN
INTRAMUSCULAR | Status: DC | PRN
  Administered 2015-02-06 (×2): 50 ug via INTRAVENOUS

## 2015-02-06 MED ORDER — FENTANYL CITRATE (PF) 100 MCG/2ML IJ SOLN
INTRAMUSCULAR | Status: AC
  Administered 2015-02-06: 25 ug via INTRAVENOUS
  Filled 2015-02-06: qty 2

## 2015-02-06 MED ORDER — ACETAMINOPHEN 160 MG/5ML PO SOLN: 325.0000 mg | ORAL | Status: DC | PRN

## 2015-02-06 MED ORDER — DEXAMETHASONE SODIUM PHOSPHATE 4 MG/ML IJ SOLN
INTRAMUSCULAR | Status: DC | PRN
  Administered 2015-02-06: 5 mg via INTRAVENOUS

## 2015-02-06 MED ORDER — CEFOXITIN SODIUM-DEXTROSE 2-2.2 GM-% IV SOLR (PREMIX)
INTRAVENOUS | Status: AC
  Filled 2015-02-06: qty 50

## 2015-02-06 MED ORDER — SODIUM CHLORIDE 0.9 % IV SOLN
INTRAVENOUS | Status: DC
  Administered 2015-02-06: 13:00:00 via INTRAVENOUS

## 2015-02-06 MED ORDER — LIDOCAINE HCL (CARDIAC) 20 MG/ML IV SOLN
INTRAVENOUS | Status: DC | PRN
  Administered 2015-02-06 (×2): 100 mg via INTRAVENOUS

## 2015-02-06 MED ORDER — LIDOCAINE-EPINEPHRINE (PF) 1 %-1:200000 IJ SOLN
INTRAMUSCULAR | Status: AC
  Filled 2015-02-06: qty 30

## 2015-02-06 MED ORDER — SODIUM CHLORIDE 0.9 % IV SOLN
INTRAVENOUS | Status: DC | PRN
  Administered 2015-02-06: 13:00:00 via INTRAVENOUS

## 2015-02-06 MED ORDER — UNJURY VANILLA POWDER: 2.0000 [oz_av] | Freq: Four times a day (QID) | ORAL | Status: DC

## 2015-02-06 MED ORDER — LIDOCAINE-EPINEPHRINE (PF) 1 %-1:200000 IJ SOLN
INTRAMUSCULAR | Status: DC | PRN
  Administered 2015-02-06: 30 mL

## 2015-02-06 MED ORDER — UNJURY CHOCOLATE CLASSIC POWDER: 2.0000 [oz_av] | Freq: Four times a day (QID) | ORAL | Status: DC

## 2015-02-06 MED ORDER — HYDROCODONE-ACETAMINOPHEN 7.5-325 MG/15ML PO SOLN: 10.0000 mL | Freq: Four times a day (QID) | ORAL | Status: DC | PRN

## 2015-02-06 MED ORDER — EPHEDRINE SULFATE 50 MG/ML IJ SOLN
INTRAMUSCULAR | Status: DC | PRN
  Administered 2015-02-06: 10 mg via INTRAVENOUS
  Administered 2015-02-06: 5 mg via INTRAVENOUS
  Administered 2015-02-06: 10 mg via INTRAVENOUS

## 2015-02-06 MED ORDER — ONDANSETRON HCL 4 MG/2ML IJ SOLN: 4.0000 mg | INTRAMUSCULAR | Status: DC | PRN

## 2015-02-06 MED ORDER — MORPHINE SULFATE (PF) 2 MG/ML IV SOLN
INTRAVENOUS | Status: AC
  Filled 2015-02-06: qty 1

## 2015-02-06 MED ORDER — CEFOXITIN SODIUM-DEXTROSE 2-2.2 GM-% IV SOLR (PREMIX)
2.0000 g | Freq: Once | INTRAVENOUS | Status: AC
  Administered 2015-02-06: 2 g via INTRAVENOUS

## 2015-02-06 MED ORDER — SUCCINYLCHOLINE CHLORIDE 20 MG/ML IJ SOLN
INTRAMUSCULAR | Status: DC | PRN
  Administered 2015-02-06: 140 mg via INTRAVENOUS
  Administered 2015-02-06: 50 mg via INTRAVENOUS

## 2015-02-06 MED ORDER — BUPIVACAINE-EPINEPHRINE (PF) 0.5% -1:200000 IJ SOLN
INTRAMUSCULAR | Status: AC
  Filled 2015-02-06: qty 30

## 2015-02-06 MED ORDER — MORPHINE SULFATE (PF) 2 MG/ML IV SOLN
2.0000 mg | INTRAVENOUS | Status: DC | PRN
  Administered 2015-02-06: 2 mg via INTRAVENOUS
  Administered 2015-02-06: 4 mg via INTRAVENOUS
  Administered 2015-02-06: 3 mg via INTRAVENOUS
  Administered 2015-02-07: 4 mg via INTRAVENOUS
  Administered 2015-02-07: 2 mg via INTRAVENOUS
  Administered 2015-02-07 (×2): 4 mg via INTRAVENOUS
  Administered 2015-02-08: 2 mg via INTRAVENOUS
  Filled 2015-02-06 (×3): qty 2
  Filled 2015-02-06: qty 1
  Filled 2015-02-06: qty 2
  Filled 2015-02-06: qty 1
  Filled 2015-02-06: qty 2

## 2015-02-06 MED ORDER — ACETAMINOPHEN 10 MG/ML IV SOLN: 1000.0000 mg | Freq: Once | INTRAVENOUS | Status: DC

## 2015-02-06 MED ORDER — ACETAMINOPHEN 10 MG/ML IV SOLN
1000.0000 mg | Freq: Four times a day (QID) | INTRAVENOUS | Status: AC
  Administered 2015-02-06 – 2015-02-07 (×3): 1000 mg via INTRAVENOUS
  Filled 2015-02-06 (×4): qty 100

## 2015-02-06 MED ORDER — UNJURY CHICKEN SOUP POWDER: 2.0000 [oz_av] | Freq: Four times a day (QID) | ORAL | Status: DC

## 2015-02-06 MED ORDER — BUPIVACAINE-EPINEPHRINE (PF) 0.5% -1:200000 IJ SOLN
INTRAMUSCULAR | Status: DC | PRN
  Administered 2015-02-06: 15 mL

## 2015-02-06 MED ORDER — SCOPOLAMINE 1 MG/3DAYS TD PT72
1.0000 | MEDICATED_PATCH | Freq: Once | TRANSDERMAL | Status: DC
  Administered 2015-02-06: 1.5 mg via TRANSDERMAL

## 2015-02-06 MED ORDER — OXYCODONE HCL 5 MG/5ML PO SOLN: 5.0000 mg | ORAL | Status: DC | PRN

## 2015-02-06 MED ORDER — SCOPOLAMINE 1 MG/3DAYS TD PT72
MEDICATED_PATCH | TRANSDERMAL | Status: AC
  Filled 2015-02-06: qty 1

## 2015-02-06 SURGICAL SUPPLY — 61 items
APPLIER CLIP ROT 13.4 12 LRG (CLIP)
APR CLP LRG 13.4X12 ROT 20 MLT (CLIP)
BANDAGE ELASTIC 6 CLIP NS LF (GAUZE/BANDAGES/DRESSINGS) ×6 IMPLANT
BLADE SURG SZ11 CARB STEEL (BLADE) ×3 IMPLANT
CANISTER SUCT 1200ML W/VALVE (MISCELLANEOUS) ×3 IMPLANT
CHLORAPREP W/TINT 26ML (MISCELLANEOUS) ×6 IMPLANT
CLIP APPLIE ROT 13.4 12 LRG (CLIP) IMPLANT
DEFOGGER SCOPE WARMER CLEARIFY (MISCELLANEOUS) ×3 IMPLANT
DRAPE UTILITY 15X26 TOWEL STRL (DRAPES) ×6 IMPLANT
FILTER LAP SMOKE EVAC STRL (MISCELLANEOUS) ×3 IMPLANT
GLOVE BIO SURGEON STRL SZ 6.5 (GLOVE) ×7 IMPLANT
GLOVE BIO SURGEON STRL SZ8 (GLOVE) ×3 IMPLANT
GLOVE BIO SURGEONS STRL SZ 6.5 (GLOVE) ×6
GOWN STRL REUS W/ TWL LRG LVL3 (GOWN DISPOSABLE) ×3 IMPLANT
GOWN STRL REUS W/ TWL XL LVL3 (GOWN DISPOSABLE) ×2 IMPLANT
GOWN STRL REUS W/TWL LRG LVL3 (GOWN DISPOSABLE) ×9
GOWN STRL REUS W/TWL XL LVL3 (GOWN DISPOSABLE) ×3
IRRIGATION STRYKERFLOW (MISCELLANEOUS) ×1 IMPLANT
IRRIGATOR STRYKERFLOW (MISCELLANEOUS) ×3
IV NS 1000ML (IV SOLUTION) ×3
IV NS 1000ML BAXH (IV SOLUTION) ×1 IMPLANT
KIT RM TURNOVER STRD PROC AR (KITS) ×3 IMPLANT
LABEL OR SOLS (LABEL) ×3 IMPLANT
LIQUID BAND (GAUZE/BANDAGES/DRESSINGS) ×3 IMPLANT
NDL INSUFF 14G 150MM VS150000 (NEEDLE) ×3 IMPLANT
NDL SAFETY 22GX1.5 (NEEDLE) ×3 IMPLANT
NS IRRIG 1000ML POUR BTL (IV SOLUTION) ×3 IMPLANT
PACK LAP CHOLECYSTECTOMY (MISCELLANEOUS) ×3 IMPLANT
RELOAD BLUE (STAPLE) ×2 IMPLANT
RELOAD STAPLE 60 2.6 WHT THN (STAPLE) IMPLANT
RELOAD STAPLE 60 3.8 GOLD REG (STAPLE) ×3 IMPLANT
RELOAD STAPLE 60 4.1 GRN THCK (STAPLE) ×1 IMPLANT
RELOAD STAPLER GOLD 60MM (STAPLE) ×7 IMPLANT
RELOAD STAPLER GREEN 60MM (STAPLE) ×1 IMPLANT
RELOAD STAPLER WHITE 60MM (STAPLE) ×2 IMPLANT
SHEARS HARMONIC ACE PLUS 45CM (MISCELLANEOUS) ×3 IMPLANT
SLEEVE ENDOPATH XCEL 5M (ENDOMECHANICALS) ×4 IMPLANT
SLEEVE GASTRECTOMY 40FR VISIGI (MISCELLANEOUS) ×3 IMPLANT
STAPLER ECHELON BIOABSB 60 FLE (MISCELLANEOUS) ×27 IMPLANT
STAPLER ECHELON LONG 60 440 (INSTRUMENTS) ×3 IMPLANT
STAPLER RELOAD GOLD 60MM (STAPLE) ×21
STAPLER RELOAD GREEN 60MM (STAPLE) ×3
STAPLER RELOAD WHITE 60MM (STAPLE) ×6
SUT DEVICE BRAIDED 0X39 (SUTURE) ×3 IMPLANT
SUT DEVICE BRAIDED 2.0X39 (SUTURE) ×17 IMPLANT
SUT DVC ABSORB BRAID 3.0X39 (SUTURE) ×11 IMPLANT
SUT DVC VICRYL PGA 2.0X39 (SUTURE) ×3 IMPLANT
SUT ETHILON 2 0 FS 18 (SUTURE) ×3 IMPLANT
SUT MNCRL 4-0 (SUTURE) ×6
SUT MNCRL 4-0 27XMFL (SUTURE) ×2
SUT VIC AB 4-0 FS2 27 (SUTURE) ×3 IMPLANT
SUT VICRYL/POLYSORB 3.0 (SUTURE) ×6 IMPLANT
SUTURE MNCRL 4-0 27XMF (SUTURE) ×2 IMPLANT
SYR 20CC LL (SYRINGE) ×3 IMPLANT
TROCAR BLADELESS 15MM (ENDOMECHANICALS) ×3 IMPLANT
TROCAR SL VERSASTEP 5M LG  B (MISCELLANEOUS) ×2
TROCAR SL VERSASTEP 5M LG B (MISCELLANEOUS) ×1 IMPLANT
TROCAR XCEL 12X100 BLDLESS (ENDOMECHANICALS) ×3 IMPLANT
TROCAR XCEL NON-BLD 5MMX100MML (ENDOMECHANICALS) ×3 IMPLANT
TUBING INSUFFLATOR HEATED (MISCELLANEOUS) ×3 IMPLANT
WATER STERILE IRR 1000ML POUR (IV SOLUTION) ×3 IMPLANT

## 2015-02-06 NOTE — Progress Notes (Signed)
ANTICOAGULATION CONSULT NOTE - Initial Consult  Pharmacy Consult for Lovenox  Indication: VTE prophylaxis  No Known Allergies  Patient Measurements: Height: 5\' 7"  (170.2 cm) Weight: (!) 316 lb (143.337 kg) IBW/kg (Calculated) : 66.1 Heparin Dosing Weight:   Vital Signs: Temp: 98.3 F (36.8 C) (09/20 1818) Temp Source: Oral (09/20 1818) BP: 120/58 mmHg (09/20 1818) Pulse Rate: 84 (09/20 1818)  Labs:  Recent Labs  02/06/15 1238  HGB 14.6  HCT 43.0    CrCl cannot be calculated (Patient has no serum creatinine result on file.).   Medical History: Past Medical History  Diagnosis Date  . Diabetes mellitus without complication   . GERD (gastroesophageal reflux disease)   . Hyperlipidemia   . Osteoarthritis   . H/O flexible sigmoidoscopy 2002    rectal bleeding   . Genital herpes   . Elephantiasis   . Venous stasis   . Rectal bleed   . Elevated PSA   . Urinary frequency   . Enlarged prostate   . Hypertension   . Sleep apnea     cpap  . Anxiety   . Patient is Jehovah's Witness     Medications:  Prescriptions prior to admission  Medication Sig Dispense Refill Last Dose  . atenolol (TENORMIN) 100 MG tablet Take 100 mg by mouth every evening.    02/05/2015 at 2100  . finasteride (PROSCAR) 5 MG tablet Take 5 mg by mouth every evening.   02/05/2015 at pm  . glipiZIDE (GLUCOTROL XL) 10 MG 24 hr tablet Take 10 mg by mouth 2 (two) times daily.    02/05/2015 at pm  . ibuprofen (ADVIL,MOTRIN) 800 MG tablet    "a week or so ago"  . Insulin Pen Needle (PEN NEEDLES 31GX5/16") 31G X 8 MM MISC    "about a week ago"  . linagliptin (TRADJENTA) 5 MG TABS tablet Take by mouth.   02/05/2015 at Unknown time  . lisinopril-hydrochlorothiazide (PRINZIDE,ZESTORETIC) 20-12.5 MG per tablet Take 2 tablets by mouth 2 (two) times daily.   02/05/2015 at pm  . lovastatin (MEVACOR) 20 MG tablet Take 20 mg by mouth every evening.    02/05/2015 at pm  . Multiple Vitamins-Minerals (CENTRUM SILVER  ADULT 50+ PO) Take by mouth.   02/05/2015 at am  . sertraline (ZOLOFT) 100 MG tablet Take 200 mg by mouth every evening.    02/05/2015 at pm  . tamsulosin (FLOMAX) 0.4 MG CAPS Take 0.8 mg by mouth every morning.    02/06/2015 at 0800  . doxazosin (CARDURA) 4 MG tablet Take 4 mg by mouth daily.   Not Taking at Unknown time  . exenatide (BYETTA 10 MCG PEN) 10 MCG/0.04ML SOPN injection    Not Taking at Unknown time  . meloxicam (MOBIC) 15 MG tablet    Not Taking at Unknown time  . metFORMIN (GLUCOPHAGE) 500 MG tablet Take 500 mg by mouth daily with breakfast.   Not Taking at Unknown time  . simvastatin (ZOCOR) 20 MG tablet Take 20 mg by mouth daily.   Not Taking at Unknown time    Assessment: BMI = 49.6  Goal of Therapy:  DVT prophylaxis   Plan:  Lovenox 30 mg SQ Q12H ordered originally.  Will adjust dose to lovenox 40 mg SQ Q12H based on BMI > 40.  Cayle Cordoba D 02/06/2015,6:41 PM

## 2015-02-06 NOTE — Anesthesia Procedure Notes (Signed)
Procedure Name: Intubation Date/Time: 02/06/2015 1:39 PM Performed by: Irving Burton Pre-anesthesia Checklist: Patient identified, Emergency Drugs available, Suction available and Patient being monitored Patient Re-evaluated:Patient Re-evaluated prior to inductionOxygen Delivery Method: Circle system utilized Preoxygenation: Pre-oxygenation with 100% oxygen Intubation Type: IV induction Ventilation: Mask ventilation without difficulty Laryngoscope Size: Mac and 3 Grade View: Grade III Tube type: Oral Tube size: 7.5 mm Number of attempts: 1 Airway Equipment and Method: Patient positioned with wedge pillow and Stylet Placement Confirmation: positive ETCO2 and breath sounds checked- equal and bilateral Secured at: 23 cm Tube secured with: Tape Dental Injury: Teeth and Oropharynx as per pre-operative assessment

## 2015-02-06 NOTE — Transfer of Care (Signed)
Immediate Anesthesia Transfer of Care Note  Patient: Brett Lam  Procedure(s) Performed: Procedure(s): LAPAROSCOPIC GASTRIC RESTRICTIVE DUODENAL PROCEDURE (DUODENAL SWITCH) (N/A)  Patient Location: PACU  Anesthesia Type:General  Level of Consciousness: awake  Airway & Oxygen Therapy: Patient Spontanous Breathing and Patient connected to face mask oxygen  Post-op Assessment: Report given to RN and Post -op Vital signs reviewed and stable  Post vital signs: Reviewed and stable  Last Vitals:  Filed Vitals:   02/06/15 1641  BP: 122/65  Pulse: 87  Temp: 36.9 C  Resp: 19    Complications: No apparent anesthesia complications

## 2015-02-06 NOTE — Progress Notes (Signed)
TO OR with abx (to be started there per Lannie Fields, RN).  Lannie Fields took Ofimer with her to OR. TO OR with SCDS, bair paws in place (heater removed prior to transport) TO OR with thermal cap in place (regular gown and sacral drsg) at [facility] CPAP reviewed by CI and sent with clothing/glasses to PACU

## 2015-02-06 NOTE — H&P (Signed)
  H and P has no change

## 2015-02-06 NOTE — Progress Notes (Signed)
Dr. Smitty Cords was contacted by telephone to confirm a diet for the patient.  An order was given to start a bariatric clear liquid diet in the morning.

## 2015-02-06 NOTE — Anesthesia Preprocedure Evaluation (Signed)
Anesthesia Evaluation  Patient identified by MRN, date of birth, ID band Patient awake    Reviewed: Allergy & Precautions, NPO status , Patient's Chart, lab work & pertinent test results  History of Anesthesia Complications Negative for: history of anesthetic complications  Airway Mallampati: II  TM Distance: >3 FB Neck ROM: Full    Dental  (+) Missing   Pulmonary sleep apnea and Continuous Positive Airway Pressure Ventilation , former smoker (quit x 40 yrs),           Cardiovascular hypertension, Pt. on medications and Pt. on home beta blockers      Neuro/Psych    GI/Hepatic GERD  Medicated,  Endo/Other  diabetes, Well Controlled, Type 2, Oral Hypoglycemic Agents  Renal/GU      Musculoskeletal  (+) Arthritis , Osteoarthritis,    Abdominal   Peds  Hematology   Anesthesia Other Findings   Reproductive/Obstetrics                             Anesthesia Physical Anesthesia Plan  ASA: III  Anesthesia Plan: General   Post-op Pain Management: MAC Combined w/ Regional for Post-op pain   Induction: Intravenous  Airway Management Planned: Oral ETT  Additional Equipment:   Intra-op Plan:   Post-operative Plan:   Informed Consent: I have reviewed the patients History and Physical, chart, labs and discussed the procedure including the risks, benefits and alternatives for the proposed anesthesia with the patient or authorized representative who has indicated his/her understanding and acceptance.     Plan Discussed with:   Anesthesia Plan Comments:         Anesthesia Quick Evaluation

## 2015-02-07 ENCOUNTER — Encounter: Payer: Self-pay | Admitting: Specialist

## 2015-02-07 LAB — CBC WITH DIFFERENTIAL/PLATELET
Basophils Absolute: 0 10*3/uL (ref 0–0.1)
Basophils Relative: 0 %
Eosinophils Absolute: 0 10*3/uL (ref 0–0.7)
Eosinophils Relative: 0 %
HCT: 38.5 % — ABNORMAL LOW (ref 40.0–52.0)
Hemoglobin: 13.2 g/dL (ref 13.0–18.0)
Lymphocytes Relative: 10 %
Lymphs Abs: 1.6 10*3/uL (ref 1.0–3.6)
MCH: 29.8 pg (ref 26.0–34.0)
MCHC: 34.3 g/dL (ref 32.0–36.0)
MCV: 86.7 fL (ref 80.0–100.0)
Monocytes Absolute: 1.9 10*3/uL — ABNORMAL HIGH (ref 0.2–1.0)
Monocytes Relative: 11 %
Neutro Abs: 13.1 10*3/uL — ABNORMAL HIGH (ref 1.4–6.5)
Neutrophils Relative %: 79 %
Platelets: 188 10*3/uL (ref 150–440)
RBC: 4.44 MIL/uL (ref 4.40–5.90)
RDW: 13.5 % (ref 11.5–14.5)
WBC: 16.6 10*3/uL — ABNORMAL HIGH (ref 3.8–10.6)

## 2015-02-07 LAB — HEMOGLOBIN AND HEMATOCRIT, BLOOD
HCT: 39.5 % — ABNORMAL LOW (ref 40.0–52.0)
Hemoglobin: 12.9 g/dL — ABNORMAL LOW (ref 13.0–18.0)

## 2015-02-07 LAB — GLUCOSE, CAPILLARY
Glucose-Capillary: 102 mg/dL — ABNORMAL HIGH (ref 65–99)
Glucose-Capillary: 122 mg/dL — ABNORMAL HIGH (ref 65–99)
Glucose-Capillary: 109 mg/dL — ABNORMAL HIGH (ref 65–99)

## 2015-02-07 MED ORDER — HYDROCODONE-ACETAMINOPHEN 7.5-325 MG/15ML PO SOLN
10.0000 mL | ORAL | Status: DC | PRN
  Administered 2015-02-07: 10 mL via ORAL
  Filled 2015-02-07: qty 15

## 2015-02-07 MED ORDER — UNJURY CHICKEN SOUP POWDER
2.0000 [oz_av] | Freq: Three times a day (TID) | ORAL | Status: DC
  Administered 2015-02-07 (×2): 2 [oz_av] via ORAL

## 2015-02-07 MED ORDER — INSULIN ASPART 100 UNIT/ML ~~LOC~~ SOLN: 2.0000 [IU] | Freq: Three times a day (TID) | SUBCUTANEOUS | Status: DC

## 2015-02-07 NOTE — Progress Notes (Signed)
Per Dr. Smitty Cords. Discontinue continuous pulse ox. Place order for fingersticks ACHS. Give 2 units of insulin for blood sugar 140-200 and 4 units for blood sugar 200 - 250. Decrease fluids to per hour.

## 2015-02-07 NOTE — Progress Notes (Signed)
Subjective: Interval History: has complaints early fullness with liquids. Urinary frequency as consistent with preop status. Limited ambulation thus far.  Objective: Vital signs in last 24 hours: Temp:  [97.7 F (36.5 C)-98.7 F (37.1 C)] 98.4 F (36.9 C) (09/21 1712) Pulse Rate:  [57-107] 60 (09/21 1712) Resp:  [16-24] 17 (09/21 1712) BP: (101-131)/(42-73) 128/42 mmHg (09/21 1712) SpO2:  [94 %-98 %] 97 % (09/21 1712)  Intake/Output from previous day: 09/20 0701 - 09/21 0700 In: 3085 [I.V.:3085] Out: 650 [Urine:625] Intake/Output this shift: Total I/O In: 1718 [I.V.:1618; IV Piggyback:100] Out: 200 [Urine:200]  General appearance: alert, cooperative and no distress Throat: lips, mucosa, and tongue normal; teeth and gums normal Chest wall: no tenderness, lungs ,clear Heart: regular rate and rhythm and S1, S2 normal Abdomen: soft, non-tender; bowel sounds normal; no masses,  no organomegaly  Results for orders placed or performed during the hospital encounter of 02/06/15 (from the past 24 hour(s))  CBC     Status: Abnormal   Collection Time: 02/06/15  6:42 PM  Result Value Ref Range   WBC 15.8 (H) 3.8 - 10.6 K/uL   RBC 4.76 4.40 - 5.90 MIL/uL   Hemoglobin 13.8 13.0 - 18.0 g/dL   HCT 40.9 81.1 - 91.4 %   MCV 87.2 80.0 - 100.0 fL   MCH 29.0 26.0 - 34.0 pg   MCHC 33.3 32.0 - 36.0 g/dL   RDW 78.2 95.6 - 21.3 %   Platelets 181 150 - 440 K/uL  Creatinine, serum     Status: None   Collection Time: 02/06/15  6:42 PM  Result Value Ref Range   Creatinine, Ser 1.17 0.61 - 1.24 mg/dL   GFR calc non Af Amer >60 >60 mL/min   GFR calc Af Amer >60 >60 mL/min  CBC WITH DIFFERENTIAL     Status: Abnormal   Collection Time: 02/07/15  4:38 AM  Result Value Ref Range   WBC 16.6 (H) 3.8 - 10.6 K/uL   RBC 4.44 4.40 - 5.90 MIL/uL   Hemoglobin 13.2 13.0 - 18.0 g/dL   HCT 08.6 (L) 57.8 - 46.9 %   MCV 86.7 80.0 - 100.0 fL   MCH 29.8 26.0 - 34.0 pg   MCHC 34.3 32.0 - 36.0 g/dL   RDW 62.9  52.8 - 41.3 %   Platelets 188 150 - 440 K/uL   Neutrophils Relative % 79 %   Neutro Abs 13.1 (H) 1.4 - 6.5 K/uL   Lymphocytes Relative 10 %   Lymphs Abs 1.6 1.0 - 3.6 K/uL   Monocytes Relative 11 %   Monocytes Absolute 1.9 (H) 0.2 - 1.0 K/uL   Eosinophils Relative 0 %   Eosinophils Absolute 0.0 0 - 0.7 K/uL   Basophils Relative 0 %   Basophils Absolute 0.0 0 - 0.1 K/uL  Glucose, capillary     Status: Abnormal   Collection Time: 02/07/15  8:41 AM  Result Value Ref Range   Glucose-Capillary 102 (H) 65 - 99 mg/dL  Glucose, capillary     Status: Abnormal   Collection Time: 02/07/15 11:24 AM  Result Value Ref Range   Glucose-Capillary 122 (H) 65 - 99 mg/dL  Hemoglobin and hematocrit, blood     Status: Abnormal   Collection Time: 02/07/15  3:59 PM  Result Value Ref Range   Hemoglobin 12.9 (L) 13.0 - 18.0 g/dL   HCT 24.4 (L) 01.0 - 27.2 %  Glucose, capillary     Status: Abnormal   Collection Time: 02/07/15  4:30 PM  Result Value Ref Range   Glucose-Capillary 109 (H) 65 - 99 mg/dL    Studies/Results: No results found.  Scheduled Meds: . acetaminophen  1,000 mg Intravenous 4 times per day  . enoxaparin (LOVENOX) injection  40 mg Subcutaneous Q12H  . insulin aspart  2-4 Units Subcutaneous TID WC  . protein supplement  2 oz Oral TID  . scopolamine  1 patch Transdermal Once   Continuous Infusions: . sodium chloride 100 mL/hr at 02/07/15 0841   PRN Meds:acetaminophen, HYDROcodone-acetaminophen, morphine injection, ondansetron (ZOFRAN) IV  Assessment/Plan:    LOS: 1 day , doing well with mild early fullness. Will plan d/c in am   Brett Lam

## 2015-02-07 NOTE — Anesthesia Postprocedure Evaluation (Signed)
  Anesthesia Post-op Note  Patient: Brett Lam  Procedure(s) Performed: Procedure(s): LAPAROSCOPIC GASTRIC RESTRICTIVE DUODENAL PROCEDURE (DUODENAL SWITCH) (N/A)  Anesthesia type:General  Patient location: PACU  Post pain: Pain level controlled  Post assessment: Post-op Vital signs reviewed, Patient's Cardiovascular Status Stable, Respiratory Function Stable, Patent Airway and No signs of Nausea or vomiting  Post vital signs: Reviewed and stable  Last Vitals:  Filed Vitals:   02/07/15 0558  BP: 117/61  Pulse: 69  Temp: 36.9 C  Resp: 24    Level of consciousness: awake, alert  and patient cooperative  Complications: No apparent anesthesia complications

## 2015-02-07 NOTE — Progress Notes (Signed)
INTERVENTION:  RD consulted for nutrition education regarding inpatient bariatric surgery.   RD provided "The Liquid Diet" handout from the Bariatric Surgery Guide from the [facility]. This handout previously provided to patient prior to surgery is a duplicate copy. Discussed what foods/liquids are consistent with a Clear Liquid Diet and reinforced Key Concepts such as no carbonation, no caffeine, or sugar containing beverages. Provided methods to prevent dehydration and promote protein intake, using clock and sample fluid schedule. RD encouraged follow-up with outpatient dietitian after discharge.  Teach back method used.  Expect good compliance.  NUTRITION DIAGNOSIS:  Food and nutrition knowledge related deficit related to recent bariatric surgery as evidenced by dietitian consult for nutrition education   GOAL:  Patient will be able to sip and tolerate CL within 24-48 hours  MONITOR:  Energy intake Digestive system  ASSESSMENT:  Pt s/p lap gastric restrictive duodenal switch   Body mass index is 49.48 kg/(m^2).   Current diet order is bariatric clear liquids with unjury supplement TID, patient is consuming approximately 77ml at this time.   Labs and medications reviewed.   LOW Care Level  Delonte Musich B. Freida Busman, RD, LDN [phone] (pager)

## 2015-02-08 LAB — COMPREHENSIVE METABOLIC PANEL
ALT: 26 U/L (ref 17–63)
AST: 39 U/L (ref 15–41)
Albumin: 3.6 g/dL (ref 3.5–5.0)
Alkaline Phosphatase: 62 U/L (ref 38–126)
Anion gap: 10 (ref 5–15)
BUN: 11 mg/dL (ref 6–20)
CO2: 25 mmol/L (ref 22–32)
Calcium: 8.8 mg/dL — ABNORMAL LOW (ref 8.9–10.3)
Chloride: 108 mmol/L (ref 101–111)
Creatinine, Ser: 0.83 mg/dL (ref 0.61–1.24)
GFR calc Af Amer: >60 mL/min (ref >60)
GFR calc non Af Amer: >60 mL/min (ref >60)
Glucose, Bld: 110 mg/dL — ABNORMAL HIGH (ref 65–99)
Potassium: 3 mmol/L — ABNORMAL LOW (ref 3.5–5.1)
Sodium: 143 mmol/L (ref 135–145)
Total Bilirubin: 1.3 mg/dL — ABNORMAL HIGH (ref 0.3–1.2)
Total Protein: 6.9 g/dL (ref 6.5–8.1)

## 2015-02-08 LAB — CBC WITH DIFFERENTIAL/PLATELET
Basophils Absolute: 0.1 10*3/uL (ref 0–0.1)
Basophils Relative: 1 %
Eosinophils Absolute: 0.1 10*3/uL (ref 0–0.7)
Eosinophils Relative: 1 %
HCT: 38.7 % — ABNORMAL LOW (ref 40.0–52.0)
Hemoglobin: 13.1 g/dL (ref 13.0–18.0)
Lymphocytes Relative: 17 %
Lymphs Abs: 2.6 10*3/uL (ref 1.0–3.6)
MCH: 29.5 pg (ref 26.0–34.0)
MCHC: 33.8 g/dL (ref 32.0–36.0)
MCV: 87.2 fL (ref 80.0–100.0)
Monocytes Absolute: 1.8 10*3/uL — ABNORMAL HIGH (ref 0.2–1.0)
Monocytes Relative: 11 %
Neutro Abs: 11.3 10*3/uL — ABNORMAL HIGH (ref 1.4–6.5)
Neutrophils Relative %: 70 %
Platelets: 198 10*3/uL (ref 150–440)
RBC: 4.44 MIL/uL (ref 4.40–5.90)
RDW: 13.6 % (ref 11.5–14.5)
WBC: 16 10*3/uL — ABNORMAL HIGH (ref 3.8–10.6)

## 2015-02-08 LAB — GLUCOSE, CAPILLARY: Glucose-Capillary: 112 mg/dL — ABNORMAL HIGH (ref 65–99)

## 2015-02-08 LAB — SURGICAL PATHOLOGY

## 2015-02-08 NOTE — Op Note (Signed)
Operative diagnosis: Morbid obesity;  Postoperative diagnosis: Same;  Procedure: Laparoscopic biliopancreatic diversion with duodenal switch  Surgeon: Smitty Cords Assistant: Valentina Lucks Complications: None Specimens: Portion of stomach Anesthesia: Gen. endotracheal Drains: None Alimentary channel: 300 cm Common channel: 250 cm Bougie size: 40 [place]  Clinical history: See history and physical  Details of procedure: The patient was taken to the operating room placed in the operating table in the supine position.  Appropriate monitors and oxygen were delivered.  Intravenous anti-biotics were given.  Timeout was performed.  Sequential stockings were placed.  The patient's placed under general anesthesia without incident.  The abdomen was prepped and draped usual sterile fashion.  The abdomen was accessed using a 5 mm optical trocar left upper outer quadrant.  Multiple other focus were placed in preparation for the procedure.  A liver retractor was placed.  The small bowel was measured from the ileocecal valve 300 cm without incident.  It was marked accordingly and tacked to the right upper quadrant with a interrupted suture.  A liver retractor was in place.  Patient's placed in steep reverse Trendelenburg and the hiatus was explored.  A moderate hiatal hernia was present in the posterior crura were mobilized.  These were reapproximated using interrupted Surgidek suture.  This is done with preservation of the esophagus and vagus nerves.  The greater curvature of the stomach was then mobilized using Harmonic scalpel from the midportion of stomach all way to the hiatus including the short gastrics.  The fundus was mobilized further up the left hemidiaphragm using Harmonic scalpel.  The dissection continued inferiorly all the way down to the junction of the duodenum and pancreas.  The posterior attachments of the pancreas and retroperitoneum were mobilized as well.  The 40 French bougie was then advanced to the antrum  and the antrum was bisected using a echelon green load reinforced with seam guard stapler.  This is at 6 cm from the pylorus.  Multiple fires of a gold load was seam guard were performed parallel to the lesser curvature.  The excess stomach was then brought out through the 15 mm port.  This is done without spillage.  The duodenum was then transected using a gold load was seam guard.  It was further mobilized in preparation to get a tension-free anastomosis.  The omentum was split in the midline to reduce tension on the anastomosis as well.  The loop of bowel that was previously mentioned was mobilized as a suture was removed and approximated to the postpyloric duodenal stump using running 2-0 Polysorb suture.  Enterotomies were made in both limbs of bowel and 3-0 Polysorb was used to create a 2nd posterior row and brought around the front using inside out outside in stitches on the corners.  A 2nd 3-0 Polysorb was run anteriorly from superior to inferior on the anastomosis and tied to the previous suture.  A leak test was then performed by clamping the 8th rent in E.  Limbs and insufflating through the bougie.  No leak was detected with high pressure insufflation performed underneath saline.  We then divided the biliopancreatic limb using a echelon white load with seam guard.  This is translocated 50 cm downstream in a side-to-side enteroenterostomy was created in the following fashion.  2 stay sutures were used to reapproximate the limbs of bowel and enterotomies were made in both.  A white load was inserted into both lumen and fired.  This was 50 mm in length.  The resulting defect was reapproximated using  interrupted Surgidek sutures and using a blue load 60 mm stapler to close defect.  Excess tissue was discarded.  The mesenteric defect between the bili pancreatic limb and the alimentary limb was closed using a running 2 Surgidek suture.  The liver retractor and trochars removed.  Blood loss was negligible.   leak was present at the time of closure.  The wounds closed using 40 Vicryls and Dermabond.

## 2015-02-27 NOTE — Discharge Summary (Signed)
Physician Discharge Summary  Patient ID: Brett Lam MRN: [id number] DOB/AGE: 1945-09-17 69 y.o.  Admit date: 02/06/2015 Discharge date: 02/27/2015  Admission Diagnoses: Morbid Obesity  Discharge Diagnoses:  Active Problems:   Morbid obesity ([place])   Discharged Condition: good  Hospital Course:  Pt had Laparoscopic Biliopancreatic Diversion and Duodenal Switch - dual anastamosis and had unremarkable post operative course. He was able to tolerate an oral diet and medications, ambulate and void without difficulty. He was discharged when this criteria was met.   Consults: None  Significant Diagnostic Studies: labs: see chart  Treatments: surgery:  Laparoscopic Biliopancreatic Diversion and Duodenal Switch - dual anastamosis  Discharge Exam: Blood pressure 138/70, pulse 68, temperature 98.4 F (36.9 C), temperature source Oral, resp. rate 16, height $RemoveBe'5\' 7"'NJOSuFDrm$  (1.702 m), weight 143.337 kg (316 lb), SpO2 96 %. discharged remotely  Disposition: 01-Home or Self Care  Discharge Instructions    Call MD for:  difficulty breathing, headache or visual disturbances    Complete by:  As directed      Call MD for:  difficulty breathing, headache or visual disturbances    Complete by:  As directed      Call MD for:  extreme fatigue    Complete by:  As directed      Call MD for:  extreme fatigue    Complete by:  As directed      Call MD for:  hives    Complete by:  As directed      Call MD for:  hives    Complete by:  As directed      Call MD for:  persistant dizziness or light-headedness    Complete by:  As directed      Call MD for:  persistant dizziness or light-headedness    Complete by:  As directed      Call MD for:  persistant nausea and vomiting    Complete by:  As directed      Call MD for:  persistant nausea and vomiting    Complete by:  As directed      Call MD for:  redness, tenderness, or signs of infection (pain, swelling, redness, odor or green/yellow discharge around  incision site)    Complete by:  As directed      Call MD for:  redness, tenderness, or signs of infection (pain, swelling, redness, odor or green/yellow discharge around incision site)    Complete by:  As directed      Call MD for:  severe uncontrolled pain    Complete by:  As directed      Call MD for:  severe uncontrolled pain    Complete by:  As directed      Call MD for:  temperature >100.4    Complete by:  As directed      Call MD for:  temperature >100.4    Complete by:  As directed      Discharge instructions    Complete by:  As directed   Remember to start your chewable or liquid B complex once you arrive home and take this daily for 30 days. You may continue to take this beyond 30 days if you choose. Stick with a Clear liquid diet for 2 days post operatively then you may advance to a Full Liquid diet for 12 days, which means you will be on a strict liquid diet for 14 days. Your daily goal is going to be to get in 60-80g of protein and  64oz of fluid.     Discharge instructions    Complete by:  As directed   Remember to start your chewable or liquid B complex once you arrive home and take this daily for 30 days. You may continue to take this beyond 30 days if you choose. Stick with a Clear liquid diet for 2 days post operatively then you may advance to a Full Liquid diet for 12 days, which means you will be on a strict liquid diet for 14 days. Your daily goal is going to be to get in 60-80g of protein and 64oz of fluid.     Driving Restrictions    Complete by:  As directed   You may not drive until 24 hours past your last dose of pain medications.     Driving Restrictions    Complete by:  As directed   You may not drive until 24 hours past your last dose of pain medications.     Increase activity slowly    Complete by:  As directed      Increase activity slowly    Complete by:  As directed      Lifting restrictions    Complete by:  As directed   Do not lift more than 10-15 pounds  for 4-6 weeks post operatively     Lifting restrictions    Complete by:  As directed   Do not lift more than 10-15 pounds for 4-6 weeks post operatively     No dressing needed    Complete by:  As directed      No dressing needed    Complete by:  As directed      No wound care    Complete by:  As directed      No wound care    Complete by:  As directed      Other Restrictions    Complete by:  As directed   Avoid using your core muscles for 30 days after surgery - motions like pushing, pulling, climbing etc. Make sure to get up and walk frequently every day to help avoid developing blood clots.     Other Restrictions    Complete by:  As directed   Avoid using your core muscles for 30 days after surgery - motions like pushing, pulling, climbing etc. Make sure to get up and walk frequently every day to help avoid developing blood clots.            Medication List    STOP taking these medications        BYETTA 10 MCG PEN 10 MCG/0.04ML Sopn injection  Generic drug:  exenatide     CENTRUM SILVER ADULT 50+ PO     glipiZIDE 10 MG 24 hr tablet  Commonly known as:  GLUCOTROL XL     ibuprofen 800 MG tablet  Commonly known as:  ADVIL,MOTRIN     linagliptin 5 MG Tabs tablet  Commonly known as:  TRADJENTA     lisinopril-hydrochlorothiazide 20-12.5 MG tablet  Commonly known as:  PRINZIDE,ZESTORETIC     meloxicam 15 MG tablet  Commonly known as:  MOBIC     metFORMIN 500 MG tablet  Commonly known as:  GLUCOPHAGE     PEN NEEDLES 31GX5/16" 31G X 8 MM Misc      TAKE these medications        atenolol 100 MG tablet  Commonly known as:  TENORMIN  Take 100 mg by mouth every evening.  doxazosin 4 MG tablet  Commonly known as:  CARDURA  Take 4 mg by mouth daily.     enoxaparin 40 MG/0.4ML injection  Commonly known as:  LOVENOX  Inject 0.4 mLs (40 mg total) into the skin daily.     finasteride 5 MG tablet  Commonly known as:  PROSCAR  Take 5 mg by mouth every evening.      HYDROcodone-acetaminophen 7.5-325 mg/15 ml solution  Commonly known as:  HYCET  Take 10 mLs by mouth 4 (four) times daily as needed for moderate pain.     lovastatin 20 MG tablet  Commonly known as:  MEVACOR  Take 20 mg by mouth every evening.     ondansetron 4 MG disintegrating tablet  Commonly known as:  ZOFRAN ODT  Take 1 tablet (4 mg total) by mouth every 8 (eight) hours as needed for nausea or vomiting.     sertraline 100 MG tablet  Commonly known as:  ZOLOFT  Take 200 mg by mouth every evening.     simvastatin 20 MG tablet  Commonly known as:  ZOCOR  Take 20 mg by mouth daily.     tamsulosin 0.4 MG Caps capsule  Commonly known as:  FLOMAX  Take 0.8 mg by mouth every morning.         SignedCarmelina Noun 02/27/2015, 3:00 PM

## 2015-06-08 ENCOUNTER — Ambulatory Visit: Payer: Medicare HMO | Admitting: Urology

## 2015-07-09 ENCOUNTER — Telehealth: Payer: Self-pay | Admitting: Gastroenterology

## 2015-07-09 NOTE — Telephone Encounter (Signed)
Colonoscopy per referral personal history of colonic polyps

## 2015-07-10 NOTE — Telephone Encounter (Signed)
LVM for pt to return my call.

## 2015-07-13 ENCOUNTER — Other Ambulatory Visit: Payer: Self-pay

## 2015-07-13 NOTE — Telephone Encounter (Signed)
Gastroenterology Pre-Procedure Review  Request Date: 07/23/15 Requesting Physician: Dr. Hillery Aldo  PATIENT REVIEW QUESTIONS: The patient responded to the following health history questions as indicated:    1. Are you having any GI issues? no 2. Do you have a personal history of Polyps? no 3. Do you have a family history of Colon Cancer or Polyps? no 4. Diabetes Mellitus? no 5. Joint replacements in the past 12 months?no 6. Major health problems in the past 3 months?no 7. Any artificial heart valves, MVP, or defibrillator?no    MEDICATIONS & ALLERGIES:    Patient reports the following regarding taking any anticoagulation/antiplatelet therapy:   Plavix, Coumadin, Eliquis, Xarelto, Lovenox, Pradaxa, Brilinta, or Effient? no Aspirin? no  Patient confirms/reports the following medications:  Current Outpatient Prescriptions  Medication Sig Dispense Refill  . atenolol (TENORMIN) 100 MG tablet Take 100 mg by mouth every evening.     Marland Kitchen atorvastatin (LIPITOR) 40 MG tablet Take 40 mg by mouth daily.    Marland Kitchen doxazosin (CARDURA) 4 MG tablet Take 4 mg by mouth daily.    Marland Kitchen lisinopril-hydrochlorothiazide (PRINZIDE,ZESTORETIC) 20-12.5 MG tablet Take by mouth.    . Multiple Vitamin (MULTI-VITAMINS) TABS Take by mouth.    . sertraline (ZOLOFT) 100 MG tablet Take 200 mg by mouth every evening.     . tamsulosin (FLOMAX) 0.4 MG CAPS Take 0.8 mg by mouth every morning.     . enoxaparin (LOVENOX) 40 MG/0.4ML injection Inject 0.4 mLs (40 mg total) into the skin daily. (Patient not taking: Reported on 07/13/2015) 14 Syringe 0  . finasteride (PROSCAR) 5 MG tablet Take 5 mg by mouth every evening. Reported on 07/13/2015    . HYDROcodone-acetaminophen (HYCET) 7.5-325 mg/15 ml solution Take 10 mLs by mouth 4 (four) times daily as needed for moderate pain. (Patient not taking: Reported on 07/13/2015) 473 mL 0  . lovastatin (MEVACOR) 20 MG tablet Take 20 mg by mouth every evening. Reported on 07/13/2015    . ondansetron  (ZOFRAN ODT) 4 MG disintegrating tablet Take 1 tablet (4 mg total) by mouth every 8 (eight) hours as needed for nausea or vomiting. (Patient not taking: Reported on 07/13/2015) 20 tablet 0  . simvastatin (ZOCOR) 20 MG tablet Take 20 mg by mouth daily. Reported on 07/13/2015     No current facility-administered medications for this visit.    Patient confirms/reports the following allergies:  No Known Allergies  No orders of the defined types were placed in this encounter.    AUTHORIZATION INFORMATION Primary Insurance: 1D#: Group #:  Secondary Insurance: 1D#: Group #:  SCHEDULE INFORMATION: Date: 07/23/15 Time: Location: [facility]

## 2015-07-16 ENCOUNTER — Other Ambulatory Visit: Payer: Self-pay

## 2015-07-16 DIAGNOSIS — Z1211 Encounter for screening for malignant neoplasm of colon: Secondary | ICD-10-CM

## 2015-07-16 MED ORDER — PEG 3350-KCL-NA BICARB-NACL 420 G PO SOLR: 4000.0000 mL | ORAL | Status: DC

## 2015-07-17 NOTE — Telephone Encounter (Signed)
Pt scheduled for screening colonoscopy at [facility] on 07/23/15. Instructs/rx mailed. Please check precert.

## 2015-07-18 ENCOUNTER — Encounter: Payer: Self-pay | Admitting: *Deleted

## 2015-07-20 NOTE — Discharge Instructions (Signed)

## 2015-07-23 ENCOUNTER — Ambulatory Visit: Payer: Medicare HMO | Admitting: Anesthesiology

## 2015-07-23 ENCOUNTER — Encounter
Admission: RE | Disposition: A | Discharge status: Home / Self Care | Payer: Self-pay | Source: Ambulatory Visit | Attending: Gastroenterology

## 2015-07-23 ENCOUNTER — Ambulatory Visit
Admission: RE | Admit: 2015-07-23 | Discharge: 2015-07-23 | Disposition: A | Discharge status: Home / Self Care | Payer: Medicare HMO | Source: Ambulatory Visit | Attending: Gastroenterology | Admitting: Gastroenterology

## 2015-07-23 DIAGNOSIS — M1990 Unspecified osteoarthritis, unspecified site: Secondary | ICD-10-CM | POA: Diagnosis not present

## 2015-07-23 DIAGNOSIS — Z79899 Other long term (current) drug therapy: Secondary | ICD-10-CM | POA: Diagnosis not present

## 2015-07-23 DIAGNOSIS — Z1211 Encounter for screening for malignant neoplasm of colon: Secondary | ICD-10-CM | POA: Diagnosis present

## 2015-07-23 DIAGNOSIS — K219 Gastro-esophageal reflux disease without esophagitis: Secondary | ICD-10-CM | POA: Insufficient documentation

## 2015-07-23 DIAGNOSIS — F419 Anxiety disorder, unspecified: Secondary | ICD-10-CM | POA: Diagnosis not present

## 2015-07-23 DIAGNOSIS — I10 Essential (primary) hypertension: Secondary | ICD-10-CM | POA: Diagnosis not present

## 2015-07-23 DIAGNOSIS — K5730 Diverticulosis of large intestine without perforation or abscess without bleeding: Secondary | ICD-10-CM | POA: Insufficient documentation

## 2015-07-23 DIAGNOSIS — E785 Hyperlipidemia, unspecified: Secondary | ICD-10-CM | POA: Diagnosis not present

## 2015-07-23 DIAGNOSIS — G4730 Sleep apnea, unspecified: Secondary | ICD-10-CM | POA: Diagnosis not present

## 2015-07-23 DIAGNOSIS — Z860100 Personal history of colon polyps, unspecified: Secondary | ICD-10-CM | POA: Insufficient documentation

## 2015-07-23 DIAGNOSIS — Z87891 Personal history of nicotine dependence: Secondary | ICD-10-CM | POA: Insufficient documentation

## 2015-07-23 DIAGNOSIS — K640 First degree hemorrhoids: Secondary | ICD-10-CM | POA: Diagnosis not present

## 2015-07-23 DIAGNOSIS — E119 Type 2 diabetes mellitus without complications: Secondary | ICD-10-CM | POA: Insufficient documentation

## 2015-07-23 HISTORY — PX: COLONOSCOPY WITH PROPOFOL: SHX5780

## 2015-07-23 HISTORY — DX: Presence of dental prosthetic device (complete) (partial): Z97.2

## 2015-07-23 HISTORY — DX: Dizziness and giddiness: R42

## 2015-07-23 SURGERY — COLONOSCOPY WITH PROPOFOL
Anesthesia: Monitor Anesthesia Care | Wound class: Contaminated

## 2015-07-23 MED ORDER — SODIUM CHLORIDE 0.9 % IV SOLN: INTRAVENOUS | Status: DC

## 2015-07-23 MED ORDER — STERILE WATER FOR IRRIGATION IR SOLN
Status: DC | PRN
  Administered 2015-07-23: 11:00:00

## 2015-07-23 MED ORDER — LIDOCAINE HCL (CARDIAC) 20 MG/ML IV SOLN
INTRAVENOUS | Status: DC | PRN
  Administered 2015-07-23: 50 mg via INTRAVENOUS

## 2015-07-23 MED ORDER — ACETAMINOPHEN 160 MG/5ML PO SOLN: 325.0000 mg | ORAL | Status: DC | PRN

## 2015-07-23 MED ORDER — ACETAMINOPHEN 325 MG PO TABS: 325.0000 mg | ORAL_TABLET | ORAL | Status: DC | PRN

## 2015-07-23 MED ORDER — PROPOFOL 10 MG/ML IV BOLUS
INTRAVENOUS | Status: DC | PRN
  Administered 2015-07-23 (×7): 20 mg via INTRAVENOUS
  Administered 2015-07-23: 70 mg via INTRAVENOUS
  Administered 2015-07-23: 30 mg via INTRAVENOUS
  Administered 2015-07-23: 20 mg via INTRAVENOUS

## 2015-07-23 MED ORDER — LACTATED RINGERS IV SOLN
INTRAVENOUS | Status: DC
  Administered 2015-07-23: 10:00:00 via INTRAVENOUS

## 2015-07-23 SURGICAL SUPPLY — 28 items

## 2015-07-23 NOTE — Anesthesia Postprocedure Evaluation (Signed)
Anesthesia Post Note  Patient: Brett Lam  Procedure(s) Performed: Procedure(s) (LRB): COLONOSCOPY WITH PROPOFOL (N/A)  Patient location during evaluation: PACU Anesthesia Type: MAC Level of consciousness: awake and alert Pain management: pain level controlled Vital Signs Assessment: post-procedure vital signs reviewed and stable Respiratory status: spontaneous breathing, nonlabored ventilation and respiratory function stable Cardiovascular status: stable and blood pressure returned to baseline Anesthetic complications: no    Alta Corning

## 2015-07-23 NOTE — H&P (Signed)
[facility]  [address], [address] [place], [place] [address] Phone: [phone] Fax : [phone]  Primary Care Physician:  Maryruth Hancock, MD Primary Gastroenterologist:  Dr. Servando Snare  Pre-Procedure History & Physical: HPI:  Brett Lam is a 70 y.o. male is here for an colonoscopy.   Past Medical History  Diagnosis Date  . Hyperlipidemia   . H/O flexible sigmoidoscopy 2002    rectal bleeding   . Genital herpes   . Elephantiasis   . Venous stasis   . Rectal bleed   . Elevated PSA   . Urinary frequency   . Enlarged prostate   . Hypertension   . Anxiety   . Patient is Jehovah's Witness   . Sleep apnea     cpap - does not need since wt loss  . Diabetes mellitus without complication (HCC)     off meds since wt loss  . GERD (gastroesophageal reflux disease)     better since wt loss  . Osteoarthritis     better since wt loss  . Wears dentures     partial upper and lower  . Dizzy spells     Past Surgical History  Procedure Laterality Date  . Colonoscopy    . Lipoma excision  2004  . Vein surgery Bilateral     legs  . Laparoscopic gastric restrictive duodenal procedure (duodenal switch) N/A 02/06/2015    Procedure: LAPAROSCOPIC GASTRIC RESTRICTIVE DUODENAL PROCEDURE (DUODENAL SWITCH);  Surgeon: Geoffry Paradise, MD;  Location: [facility] ORS;  Service: General;  Laterality: N/A;    Prior to Admission medications   Medication Sig Start Date End Date Taking? Authorizing Provider  cetirizine (ZYRTEC) 10 MG tablet Take 10 mg by mouth daily.   Yes Historical Provider, MD  lisinopril (PRINIVIL,ZESTRIL) 10 MG tablet Take 10 mg by mouth daily.   Yes Historical Provider, MD  atorvastatin (LIPITOR) 40 MG tablet Take 40 mg by mouth daily.    Historical Provider, MD  Multiple Vitamin (MULTI-VITAMINS) TABS Take by mouth.    Historical Provider, MD  polyethylene glycol-electrolytes (TRILYTE) 420 g solution Take 4,000 mLs by mouth as directed. Drink one 8 oz glass every 30 mins  until stools are clear 07/16/15   Midge Minium, MD  sertraline (ZOLOFT) 100 MG tablet Take 200 mg by mouth every evening.  08/04/12   Historical Provider, MD  tamsulosin (FLOMAX) 0.4 MG CAPS Take 0.8 mg by mouth every morning.  08/04/12   Historical Provider, MD    Allergies as of 07/13/2015  . (No Known Allergies)    Family History  Problem Relation Age of Onset  . Hypertension Other     Social History   Social History  . Marital Status: Married    Spouse Name: N/A  . Number of Children: N/A  . Years of Education: N/A   Occupational History  . Not on file.   Social History Main Topics  . Smoking status: Former Smoker -- 1.00 packs/day for 6 years    Types: Cigarettes    Quit date: 01/26/1971  . Smokeless tobacco: Not on file  . Alcohol Use: No  . Drug Use: No  . Sexual Activity: Not on file   Other Topics Concern  . Not on file   Social History Narrative    Review of Systems: See HPI, otherwise negative ROS  Physical Exam: BP 123/76 mmHg  Pulse 69  Temp(Src) 97.3 F (36.3 C) (Temporal)  Resp 16  Ht 5\' 8"  (1.727 m)  Wt  203 lb (92.08 kg)  BMI 30.87 kg/m2  SpO2 100% General:   Alert,  pleasant and cooperative in NAD Head:  Normocephalic and atraumatic. Neck:  Supple; no masses or thyromegaly. Lungs:  Clear throughout to auscultation.    Heart:  Regular rate and rhythm. Abdomen:  Soft, nontender and nondistended. Normal bowel sounds, without guarding, and without rebound.   Neurologic:  Alert and  oriented x4;  grossly normal neurologically.  Impression/Plan: Brett Lam is here for an colonoscopy to be performed for history of colon polyps  Risks, benefits, limitations, and alternatives regarding  colonoscopy have been reviewed with the patient.  Questions have been answered.  All parties agreeable.   Darlina Rumpf, MD  07/23/2015, 10:00 AM

## 2015-07-23 NOTE — Anesthesia Preprocedure Evaluation (Signed)
Anesthesia Evaluation  Patient identified by MRN, date of birth, ID band Patient awake    Reviewed: Allergy & Precautions, H&P , NPO status , Patient's Chart, lab work & pertinent test results, reviewed documented beta blocker date and time   Airway Mallampati: II  TM Distance: >3 FB Neck ROM: full    Dental  (+) Partial Upper   Pulmonary sleep apnea , former smoker,    Pulmonary exam normal breath sounds clear to auscultation       Cardiovascular Exercise Tolerance: Good hypertension, Normal cardiovascular exam Rhythm:regular Rate:Normal     Neuro/Psych negative neurological ROS  negative psych ROS   GI/Hepatic Neg liver ROS, GERD  Controlled,  Endo/Other  negative endocrine ROS  Renal/GU negative Renal ROS  negative genitourinary   Musculoskeletal   Abdominal   Peds  Hematology negative hematology ROS (+)   Anesthesia Other Findings   Reproductive/Obstetrics negative OB ROS                             Anesthesia Physical Anesthesia Plan  ASA: II  Anesthesia Plan: MAC   Post-op Pain Management:    Induction: Intravenous  Airway Management Planned: Nasal Cannula  Additional Equipment:   Intra-op Plan:   Post-operative Plan:   Informed Consent: I have reviewed the patients History and Physical, chart, labs and discussed the procedure including the risks, benefits and alternatives for the proposed anesthesia with the patient or authorized representative who has indicated his/her understanding and acceptance.   Dental Advisory Given  Plan Discussed with: CRNA  Anesthesia Plan Comments:         Anesthesia Quick Evaluation

## 2015-07-23 NOTE — Op Note (Signed)
[facility] Gastroenterology Patient Name: Brett Lam Procedure Date: 07/23/2015 11:00 AM MRN: [id number] Account #: [id number] Date of Birth: 09/03/1945 Admit Type: Outpatient Age: 70 Room: [facility] OR ROOM 01 Gender: Male Note Status: Finalized Procedure:            Colonoscopy Indications:          High risk colon cancer surveillance: Personal history                        of colonic polyps Providers:            Midge Minium, MD Referring MD:         Hillery Aldo, MD (Referring MD) Medicines:            Propofol per Anesthesia Complications:        No immediate complications. Procedure:            Pre-Anesthesia Assessment:                       - Prior to the procedure, a History and Physical was                        performed, and patient medications and allergies were                        reviewed. The patient's tolerance of previous                        anesthesia was also reviewed. The risks and benefits of                        the procedure and the sedation options and risks were                        discussed with the patient. All questions were                        answered, and informed consent was obtained. Prior                        Anticoagulants: The patient has taken no previous                        anticoagulant or antiplatelet agents. ASA Grade                        Assessment: II - A patient with mild systemic disease.                        After reviewing the risks and benefits, the patient was                        deemed in satisfactory condition to undergo the                        procedure.                       After obtaining informed consent, the colonoscope was  passed under direct vision. Throughout the procedure,                        the patient's blood pressure, pulse, and oxygen                        saturations were monitored continuously. The Olympus CF                        H180AL  colonoscope (S#: [id number]) was introduced through                        the anus and advanced to the the cecum, identified by                        appendiceal orifice and ileocecal valve. The                        colonoscopy was performed without difficulty. The                        patient tolerated the procedure well. The quality of                        the bowel preparation was excellent. Findings:      The perianal and digital rectal examinations were normal.      Multiple small-mouthed diverticula were found in the sigmoid colon.      Non-bleeding internal hemorrhoids were found during retroflexion. The       hemorrhoids were Grade I (internal hemorrhoids that do not prolapse). Impression:           - Diverticulosis in the sigmoid colon.                       - Non-bleeding internal hemorrhoids.                       - No specimens collected. Recommendation:       - Repeat colonoscopy in 5 years for surveillance. Procedure Code(s):    --- Professional ---                       [phone], Colonoscopy, flexible; diagnostic, including                        collection of specimen(s) by brushing or washing, when                        performed (separate procedure) Diagnosis Code(s):    --- Professional ---                       Z86.010, Personal history of colonic polyps                       K64.0, First degree hemorrhoids CPT copyright 2016 American Medical Association. All rights reserved. The codes documented in this report are preliminary and upon coder review may  be revised to meet current compliance requirements. Midge Minium, MD 07/23/2015 11:37:50 AM This report has been signed electronically. Number of Addenda: 0 Note Initiated On: 07/23/2015 11:00 AM Scope Withdrawal Time: 0 hours  10 minutes 40 seconds  Total Procedure Duration: 0 hours 21 minutes 50 seconds       [facility]

## 2015-07-23 NOTE — Transfer of Care (Signed)
Immediate Anesthesia Transfer of Care Note  Patient: Brett Lam  Procedure(s) Performed: Procedure(s): COLONOSCOPY WITH PROPOFOL (N/A)  Patient Location: PACU  Anesthesia Type: MAC  Level of Consciousness: awake, alert  and patient cooperative  Airway and Oxygen Therapy: Patient Spontanous Breathing and Patient connected to supplemental oxygen  Post-op Assessment: Post-op Vital signs reviewed, Patient's Cardiovascular Status Stable, Respiratory Function Stable, Patent Airway and No signs of Nausea or vomiting  Post-op Vital Signs: Reviewed and stable  Complications: No apparent anesthesia complications

## 2015-07-23 NOTE — Anesthesia Procedure Notes (Signed)
Procedure Name: MAC Performed by: Idella Lamontagne Pre-anesthesia Checklist: Patient identified, Emergency Drugs available, Suction available, Timeout performed and Patient being monitored Patient Re-evaluated:Patient Re-evaluated prior to inductionOxygen Delivery Method: Nasal cannula Placement Confirmation: positive ETCO2       

## 2015-07-24 ENCOUNTER — Encounter: Payer: Self-pay | Admitting: Gastroenterology

## 2016-01-08 ENCOUNTER — Other Ambulatory Visit: Payer: Self-pay | Admitting: Family Medicine

## 2016-01-08 DIAGNOSIS — R3121 Asymptomatic microscopic hematuria: Secondary | ICD-10-CM

## 2016-01-22 ENCOUNTER — Ambulatory Visit: Payer: Medicare HMO

## 2016-03-04 ENCOUNTER — Encounter: Payer: Self-pay | Admitting: *Deleted

## 2016-03-04 ENCOUNTER — Ambulatory Visit: Payer: Medicare Other | Admitting: Cardiology

## 2016-03-19 ENCOUNTER — Other Ambulatory Visit: Payer: Medicare Other

## 2016-06-24 DIAGNOSIS — R197 Diarrhea, unspecified: Secondary | ICD-10-CM | POA: Insufficient documentation

## 2016-12-09 ENCOUNTER — Other Ambulatory Visit: Payer: Self-pay | Admitting: Otolaryngology

## 2016-12-09 DIAGNOSIS — E041 Nontoxic single thyroid nodule: Secondary | ICD-10-CM

## 2016-12-23 ENCOUNTER — Ambulatory Visit
Admission: RE | Admit: 2016-12-23 | Discharge: 2016-12-23 | Disposition: A | Discharge status: Home / Self Care | Payer: Medicare HMO | Source: Ambulatory Visit | Attending: Otolaryngology | Admitting: Otolaryngology

## 2016-12-23 DIAGNOSIS — E042 Nontoxic multinodular goiter: Secondary | ICD-10-CM | POA: Insufficient documentation

## 2016-12-23 DIAGNOSIS — E041 Nontoxic single thyroid nodule: Secondary | ICD-10-CM

## 2016-12-23 NOTE — Procedures (Signed)
[place] Left Thyroid biopsy without difficulty  Complications:  None  Blood Loss: none  See dictation in canopy pacs

## 2016-12-24 LAB — CYTOLOGY - NON PAP

## 2017-05-25 ENCOUNTER — Ambulatory Visit: Payer: Medicare Other | Admitting: Physician Assistant

## 2017-06-02 ENCOUNTER — Encounter: Payer: Self-pay | Admitting: Medical Oncology

## 2017-06-02 ENCOUNTER — Inpatient Hospital Stay
Admission: EM | Admit: 2017-06-02 | Discharge: 2017-06-04 | DRG: 641 | Disposition: A | Discharge status: Home / Self Care | Payer: Medicare HMO | Attending: Internal Medicine | Admitting: Internal Medicine

## 2017-06-02 ENCOUNTER — Other Ambulatory Visit: Payer: Self-pay

## 2017-06-02 DIAGNOSIS — Z87891 Personal history of nicotine dependence: Secondary | ICD-10-CM

## 2017-06-02 DIAGNOSIS — Z8249 Family history of ischemic heart disease and other diseases of the circulatory system: Secondary | ICD-10-CM

## 2017-06-02 DIAGNOSIS — E876 Hypokalemia: Secondary | ICD-10-CM | POA: Diagnosis not present

## 2017-06-02 DIAGNOSIS — F0390 Unspecified dementia without behavioral disturbance: Secondary | ICD-10-CM | POA: Diagnosis present

## 2017-06-02 DIAGNOSIS — E785 Hyperlipidemia, unspecified: Secondary | ICD-10-CM | POA: Diagnosis present

## 2017-06-02 DIAGNOSIS — I10 Essential (primary) hypertension: Secondary | ICD-10-CM | POA: Diagnosis present

## 2017-06-02 DIAGNOSIS — E860 Dehydration: Secondary | ICD-10-CM | POA: Diagnosis present

## 2017-06-02 DIAGNOSIS — F329 Major depressive disorder, single episode, unspecified: Secondary | ICD-10-CM | POA: Diagnosis present

## 2017-06-02 DIAGNOSIS — G4730 Sleep apnea, unspecified: Secondary | ICD-10-CM | POA: Diagnosis present

## 2017-06-02 DIAGNOSIS — K219 Gastro-esophageal reflux disease without esophagitis: Secondary | ICD-10-CM | POA: Diagnosis present

## 2017-06-02 DIAGNOSIS — E119 Type 2 diabetes mellitus without complications: Secondary | ICD-10-CM | POA: Diagnosis present

## 2017-06-02 DIAGNOSIS — I493 Ventricular premature depolarization: Secondary | ICD-10-CM

## 2017-06-02 LAB — URINALYSIS, COMPLETE (UACMP) WITH MICROSCOPIC
Bilirubin Urine: NEGATIVE
Glucose, UA: NEGATIVE mg/dL
Ketones, ur: NEGATIVE mg/dL
Leukocytes, UA: NEGATIVE
Nitrite: NEGATIVE
Protein, ur: NEGATIVE mg/dL
Specific Gravity, Urine: 1.011 (ref 1.005–1.030)
pH: 5 (ref 5.0–8.0)

## 2017-06-02 LAB — BASIC METABOLIC PANEL
Anion gap: 9 (ref 5–15)
BUN: 37 mg/dL — ABNORMAL HIGH (ref 6–20)
CO2: 17 mmol/L — ABNORMAL LOW (ref 22–32)
Calcium: 8.4 mg/dL — ABNORMAL LOW (ref 8.9–10.3)
Chloride: 116 mmol/L — ABNORMAL HIGH (ref 101–111)
Creatinine, Ser: 1.26 mg/dL — ABNORMAL HIGH (ref 0.61–1.24)
GFR calc Af Amer: >60 mL/min (ref >60)
GFR calc non Af Amer: 56 mL/min — ABNORMAL LOW (ref >60)
Glucose, Bld: 93 mg/dL (ref 65–99)
Potassium: 2 mmol/L — CL (ref 3.5–5.1)
Sodium: 142 mmol/L (ref 135–145)

## 2017-06-02 LAB — CBC
HCT: 39.6 % — ABNORMAL LOW (ref 40.0–52.0)
Hemoglobin: 13.3 g/dL (ref 13.0–18.0)
MCH: 31.1 pg (ref 26.0–34.0)
MCHC: 33.5 g/dL (ref 32.0–36.0)
MCV: 92.9 fL (ref 80.0–100.0)
Platelets: 222 10*3/uL (ref 150–440)
RBC: 4.26 MIL/uL — ABNORMAL LOW (ref 4.40–5.90)
RDW: 13.2 % (ref 11.5–14.5)
WBC: 8.2 10*3/uL (ref 3.8–10.6)

## 2017-06-02 LAB — TROPONIN I: Troponin I: 0.03 ng/mL (ref <0.03)

## 2017-06-02 LAB — MAGNESIUM: Magnesium: 2.1 mg/dL (ref 1.7–2.4)

## 2017-06-02 LAB — GLUCOSE, CAPILLARY: Glucose-Capillary: 111 mg/dL — ABNORMAL HIGH (ref 65–99)

## 2017-06-02 MED ORDER — SENNOSIDES-DOCUSATE SODIUM 8.6-50 MG PO TABS: 1.0000 | ORAL_TABLET | Freq: Every evening | ORAL | Status: DC | PRN

## 2017-06-02 MED ORDER — ATORVASTATIN CALCIUM 20 MG PO TABS
40.0000 mg | ORAL_TABLET | Freq: Every day | ORAL | Status: DC
  Administered 2017-06-03 – 2017-06-04 (×2): 40 mg via ORAL
  Filled 2017-06-02 (×2): qty 2

## 2017-06-02 MED ORDER — SERTRALINE HCL 50 MG PO TABS
200.0000 mg | ORAL_TABLET | Freq: Every evening | ORAL | Status: DC
  Administered 2017-06-02: 200 mg via ORAL
  Filled 2017-06-02: qty 4

## 2017-06-02 MED ORDER — ACETAMINOPHEN 325 MG PO TABS
650.0000 mg | ORAL_TABLET | Freq: Four times a day (QID) | ORAL | Status: DC | PRN
Start: 2017-06-02 — End: 2017-06-04

## 2017-06-02 MED ORDER — INSULIN ASPART 100 UNIT/ML ~~LOC~~ SOLN: 0.0000 [IU] | Freq: Every day | SUBCUTANEOUS | Status: DC

## 2017-06-02 MED ORDER — ONDANSETRON HCL 4 MG PO TABS: 4.0000 mg | ORAL_TABLET | Freq: Four times a day (QID) | ORAL | Status: DC | PRN

## 2017-06-02 MED ORDER — HYDROCODONE-ACETAMINOPHEN 5-325 MG PO TABS: 1.0000 | ORAL_TABLET | ORAL | Status: DC | PRN

## 2017-06-02 MED ORDER — ALBUTEROL SULFATE (2.5 MG/3ML) 0.083% IN NEBU: 2.5000 mg | INHALATION_SOLUTION | RESPIRATORY_TRACT | Status: DC | PRN

## 2017-06-02 MED ORDER — HEPARIN SODIUM (PORCINE) 5000 UNIT/ML IJ SOLN
5000.0000 [IU] | Freq: Three times a day (TID) | INTRAMUSCULAR | Status: DC
  Administered 2017-06-02 – 2017-06-04 (×5): 5000 [IU] via SUBCUTANEOUS
  Filled 2017-06-02 (×5): qty 1

## 2017-06-02 MED ORDER — POTASSIUM CHLORIDE 10 MEQ/100ML IV SOLN
10.0000 meq | INTRAVENOUS | Status: AC
  Administered 2017-06-02: 10 meq via INTRAVENOUS
  Filled 2017-06-02 (×2): qty 100

## 2017-06-02 MED ORDER — POTASSIUM CHLORIDE 20 MEQ/15ML (10%) PO SOLN
20.0000 meq | Freq: Once | ORAL | Status: AC
  Administered 2017-06-02: 20 meq via ORAL
  Filled 2017-06-02: qty 15

## 2017-06-02 MED ORDER — BISACODYL 5 MG PO TBEC: 5.0000 mg | DELAYED_RELEASE_TABLET | Freq: Every day | ORAL | Status: DC | PRN

## 2017-06-02 MED ORDER — LISINOPRIL 10 MG PO TABS
10.0000 mg | ORAL_TABLET | Freq: Every day | ORAL | Status: DC
  Administered 2017-06-02 – 2017-06-04 (×3): 10 mg via ORAL
  Filled 2017-06-02 (×3): qty 1

## 2017-06-02 MED ORDER — ONDANSETRON HCL 4 MG/2ML IJ SOLN: 4.0000 mg | Freq: Four times a day (QID) | INTRAMUSCULAR | Status: DC | PRN

## 2017-06-02 MED ORDER — INSULIN ASPART 100 UNIT/ML ~~LOC~~ SOLN: 0.0000 [IU] | Freq: Three times a day (TID) | SUBCUTANEOUS | Status: DC

## 2017-06-02 MED ORDER — ACETAMINOPHEN 650 MG RE SUPP: 650.0000 mg | Freq: Four times a day (QID) | RECTAL | Status: DC | PRN

## 2017-06-02 MED ORDER — TAMSULOSIN HCL 0.4 MG PO CAPS
0.8000 mg | ORAL_CAPSULE | ORAL | Status: DC
  Administered 2017-06-03 – 2017-06-04 (×2): 0.8 mg via ORAL
  Filled 2017-06-02 (×2): qty 2

## 2017-06-02 MED ORDER — POTASSIUM CHLORIDE CRYS ER 20 MEQ PO TBCR
40.0000 meq | EXTENDED_RELEASE_TABLET | Freq: Two times a day (BID) | ORAL | Status: AC
  Administered 2017-06-02 – 2017-06-03 (×2): 40 meq via ORAL
  Filled 2017-06-02 (×2): qty 2

## 2017-06-02 MED ORDER — POTASSIUM CHLORIDE IN NACL 40-0.9 MEQ/L-% IV SOLN
INTRAVENOUS | Status: DC
  Administered 2017-06-02 – 2017-06-03 (×2): 75 mL/h via INTRAVENOUS
  Filled 2017-06-02 (×4): qty 1000

## 2017-06-02 NOTE — ED Notes (Signed)
Date and time results received: 06/02/17 1610 (use smartphrase ".now" to insert current time)  Test: potassium Critical Value: 2.0  Name of Provider Notified: MD Quale  Orders Received? Or Actions Taken?: Orders Received - See Orders for details

## 2017-06-02 NOTE — ED Triage Notes (Signed)
Pt was sent to ed from PCP office, blood work done yesterday and potassium was 2.5. Pt reports weakness. Denies pain.

## 2017-06-02 NOTE — ED Notes (Signed)
Meal tray ordered for pt, regular order placed, admitting MD aware

## 2017-06-02 NOTE — ED Notes (Signed)
Dietary called and notified Erica to send meal tray to room 242 for pt

## 2017-06-02 NOTE — H&P (Addendum)
[facility] [place] [facility]   PATIENT NAME: Brett Lam    MR#:  [id number]  DATE OF BIRTH:  Mar 09, 1946  DATE OF ADMISSION:  06/02/2017  PRIMARY CARE PHYSICIAN: Hillery Aldo, MD   REQUESTING/REFERRING PHYSICIAN: Dr. Fanny Bien  CHIEF COMPLAINT:   Chief Complaint  Patient presents with  . Weakness  . Abnormal Lab   Generalized weakness and low potassium HISTORY OF PRESENT ILLNESS:  Brett Lam  is a 72 y.o. male with a known history of multiple medical problems as below.  The patient presented to ED with generalized weakness and low potassium.  The patient has generalized weakness recently and went to PCPs office yesterday.  He was sent to ED by PCP due to low potassium at 2.5.  The patient has taken water pill for the past 1 months for leg edema.  He urinated a lot.  He denies any nausea, vomiting or diarrhea.  Potassium is 2.0 in the ED.  Per his wife, he has had some balance issue at home for period of time.  PAST MEDICAL HISTORY:   Past Medical History:  Diagnosis Date  . Anxiety   . Diabetes mellitus without complication (HCC)    off meds since wt loss  . Dizzy spells   . Elephantiasis   . Elevated PSA   . Enlarged prostate   . Genital herpes   . GERD (gastroesophageal reflux disease)    better since wt loss  . H/O flexible sigmoidoscopy 2002   rectal bleeding   . Hyperlipidemia   . Hypertension   . Osteoarthritis    better since wt loss  . Patient is Jehovah's Witness   . Rectal bleed   . Sleep apnea    cpap - does not need since wt loss  . Urinary frequency   . Venous stasis   . Wears dentures    partial upper and lower    PAST SURGICAL HISTORY:   Past Surgical History:  Procedure Laterality Date  . COLONOSCOPY    . COLONOSCOPY WITH PROPOFOL N/A 07/23/2015   Procedure: COLONOSCOPY WITH PROPOFOL;  Surgeon: Midge Minium, MD;  Location[facility];  Service: Endoscopy;  Laterality: N/A;  . LAPAROSCOPIC GASTRIC RESTRICTIVE  DUODENAL PROCEDURE (DUODENAL SWITCH) N/A 02/06/2015   Procedure: LAPAROSCOPIC GASTRIC RESTRICTIVE DUODENAL PROCEDURE (DUODENAL SWITCH);  Surgeon: Geoffry Paradise, MD;  Location: [facility];  Service: General;  Laterality: N/A;  . LIPOMA EXCISION  2004  . VEIN SURGERY Bilateral    legs    SOCIAL HISTORY:   Social History   Tobacco Use  . Smoking status: Former Smoker    Packs/day: 1.00    Years: 6.00    Pack years: 6.00    Types: Cigarettes    Last attempt to quit: 01/26/1971    Years since quitting: 46.3  . Smokeless tobacco: Never Used  Substance Use Topics  . Alcohol use: No    FAMILY HISTORY:   Family History  Problem Relation Age of Onset  . Hypertension Other     DRUG ALLERGIES:  No Known Allergies  REVIEW OF SYSTEMS:   Review of Systems  Constitutional: Positive for malaise/fatigue. Negative for chills and fever.  HENT: Negative for sore throat.   Eyes: Negative for blurred vision and double vision.  Respiratory: Negative for cough, hemoptysis, shortness of breath, wheezing and stridor.   Cardiovascular: Negative for chest pain, palpitations, orthopnea and leg swelling.  Gastrointestinal: Negative for abdominal pain, blood in stool, diarrhea, melena,  nausea and vomiting.  Genitourinary: Positive for frequency. Negative for dysuria, flank pain, hematuria and urgency.  Musculoskeletal: Negative for back pain and joint pain.  Skin: Negative for rash.  Neurological: Positive for weakness. Negative for dizziness, sensory change, focal weakness, seizures, loss of consciousness and headaches.  Endo/Heme/Allergies: Negative for polydipsia.  Psychiatric/Behavioral: Negative for depression. The patient is not nervous/anxious.     MEDICATIONS AT HOME:   Prior to Admission medications   Medication Sig Start Date End Date Taking? Authorizing Provider  atorvastatin (LIPITOR) 40 MG tablet Take 40 mg by mouth daily.    [provider]  cetirizine (ZYRTEC) 10 MG tablet  Take 10 mg by mouth daily.    [provider]  lisinopril (PRINIVIL,ZESTRIL) 10 MG tablet Take 10 mg by mouth daily.    [provider]  Multiple Vitamin (MULTI-VITAMINS) TABS Take by mouth.    [provider]  polyethylene glycol-electrolytes (TRILYTE) 420 g solution Take 4,000 mLs by mouth as directed. Drink one 8 oz glass every 30 mins until stools are clear Patient not taking: Reported on 12/23/2016 07/16/15   Midge Minium, MD  sertraline (ZOLOFT) 100 MG tablet Take 200 mg by mouth every evening.  08/04/12   [provider]  tamsulosin (FLOMAX) 0.4 MG CAPS Take 0.8 mg by mouth every morning.  08/04/12   [provider]      VITAL SIGNS:  Blood pressure (!) 147/82, pulse (!) 51, temperature 98.1 F (36.7 C), temperature source Oral, resp. rate 17, height 5\' 7"  (1.702 m), weight 160 lb (72.6 kg), SpO2 99 %.  PHYSICAL EXAMINATION:  Physical Exam  GENERAL:  72 y.o.-year-old patient lying in the bed with no acute distress.  EYES: Pupils equal, round, reactive to light and accommodation. No scleral icterus. Extraocular muscles intact.  HEENT: Head atraumatic, normocephalic. Oropharynx and nasopharynx clear.  NECK:  Supple, no jugular venous distention. No thyroid enlargement, no tenderness.  LUNGS: Normal breath sounds bilaterally, no wheezing, rales,rhonchi or crepitation. No use of accessory muscles of respiration.  CARDIOVASCULAR: S1, S2 normal. No murmurs, rubs, or gallops.  ABDOMEN: Soft, nontender, nondistended. Bowel sounds present. No organomegaly or mass.  EXTREMITIES: No cyanosis, or clubbing.  Bilateral leg trace knee edema near the ankles. NEUROLOGIC: Cranial nerves II through XII are intact. Muscle strength 5/5 in all extremities. Sensation intact. Gait not checked.  PSYCHIATRIC: The patient is alert and oriented x 3.  SKIN: No obvious rash, lesion, or ulcer.   LABORATORY PANEL:   CBC Recent Labs  Lab 06/02/17 1521  WBC 8.2  HGB  13.3  HCT 39.6*  PLT 222   ------------------------------------------------------------------------------------------------------------------  Chemistries  Recent Labs  Lab 06/02/17 1521  NA 142  K 2.0*  CL 116*  CO2 17*  GLUCOSE 93  BUN 37*  CREATININE 1.26*  CALCIUM 8.4*  MG 2.1   ------------------------------------------------------------------------------------------------------------------  Cardiac Enzymes Recent Labs  Lab 06/02/17 1521  TROPONINI 0.03*   ------------------------------------------------------------------------------------------------------------------  RADIOLOGY:  No results found.    IMPRESSION AND PLAN:   Severe hypokalemia. The patient will be admitted to medical floor. Give IV and p.o. potassium, follow-up potassium level and magnesium level.  Dehydration.  Start gentle IV fluid support and follow-up BMP.  Hypertension.  Continue home hypertension medication.  Diabetes.  Start sliding scale.  All the records are reviewed and case discussed with ED provider. Management plans discussed with the patient, family and they are in agreement.  CODE STATUS: Full code  TOTAL TIME TAKING CARE OF  THIS PATIENT: 55 minutes.    Shaune Pollack M.D on 06/02/2017 at 4:46 PM  Between 7am to 6pm - Pager - [phone]  After 6pm go to [email or web address] - Social research officer, government  [facility] [place] [facility]  Office  [phone]  CC: Primary care physician; Hillery Aldo, MD   Note: This dictation was prepared with Dragon dictation along with smaller phrase technology. Any transcriptional errors that result from this process are unin

## 2017-06-02 NOTE — ED Notes (Signed)
Pt up to toilet 

## 2017-06-02 NOTE — ED Provider Notes (Signed)
[facility] Emergency Department Provider Note   ____________________________________________   First MD Initiated Contact with Patient 06/02/17 1539     (approximate)  I have reviewed the triage vital signs and the nursing notes.   HISTORY  Chief Complaint Weakness and Abnormal Lab    HPI Brett Lam is a 72 y.o. male reports is been feeling quite fatigued for about a month.  He had increasing fatigue, also lower extremity edema and is been on Lasix for this.  He is also on potassium repletion orally, but states he cannot handle the oral pills well.  Saw his doctor yesterday, they stopped his Lasix and check his potassium about a week ago.  He got a call today that his potassium was 2.5, recommend he come to the ER that he might need to be hospitalized.  Reports increasing feeling of fatigue, general muscle weakness.  No focal weakness.  Denies being any pain.  Feels very lightheaded, wife reports when he is walking he seems very fatigued.    Past Medical History:  Diagnosis Date  . Anxiety   . Diabetes mellitus without complication (HCC)    off meds since wt loss  . Dizzy spells   . Elephantiasis   . Elevated PSA   . Enlarged prostate   . Genital herpes   . GERD (gastroesophageal reflux disease)    better since wt loss  . H/O flexible sigmoidoscopy 2002   rectal bleeding   . Hyperlipidemia   . Hypertension   . Osteoarthritis    better since wt loss  . Patient is Jehovah's Witness   . Rectal bleed   . Sleep apnea    cpap - does not need since wt loss  . Urinary frequency   . Venous stasis   . Wears dentures    partial upper and lower    Patient Active Problem List   Diagnosis Date Noted  . Hypokalemia 06/02/2017  . Personal history of colonic polyps   . First degree hemorrhoids   . Chest tightness 09/28/2012  . Morbid obesity (HCC) 09/28/2012  . Sleep apnea 09/28/2012  . HTN (hypertension) 09/28/2012  . Hyperlipidemia  09/28/2012  . Essential (primary) hypertension 09/28/2012  . 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 COLONOSCOPY    . COLONOSCOPY WITH PROPOFOL N/A 07/23/2015   Procedure: COLONOSCOPY WITH PROPOFOL;  Surgeon: Midge Minium, MD;  Location: [facility];  Service: Endoscopy;  Laterality: N/A;  . LAPAROSCOPIC GASTRIC RESTRICTIVE DUODENAL PROCEDURE (DUODENAL SWITCH) N/A 02/06/2015   Procedure: LAPAROSCOPIC GASTRIC RESTRICTIVE DUODENAL PROCEDURE (DUODENAL SWITCH);  Surgeon: Geoffry Paradise, MD;  Location: [facility];  Service: General;  Laterality: N/A;  . LIPOMA EXCISION  2004  . VEIN SURGERY Bilateral    legs    Prior to Admission medications   Medication Sig Start Date End Date Taking? Authorizing Provider  atorvastatin (LIPITOR) 40 MG tablet Take 40 mg by mouth daily.    [provider]  cetirizine (ZYRTEC) 10 MG tablet Take 10 mg by mouth daily.    [provider]  lisinopril (PRINIVIL,ZESTRIL) 10 MG tablet Take 10 mg by mouth daily.    [provider]  Multiple Vitamin (MULTI-VITAMINS) TABS Take by mouth.    [provider]  polyethylene glycol-electrolytes (TRILYTE) 420 g solution Take 4,000 mLs by mouth as directed. Drink one 8 oz glass every 30 mins until stools are clear Patient not taking: Reported  on 12/23/2016 07/16/15   Midge Minium, MD  sertraline (ZOLOFT) 100 MG tablet Take 200 mg by mouth every evening.  08/04/12   [provider]  tamsulosin (FLOMAX) 0.4 MG CAPS Take 0.8 mg by mouth every morning.  08/04/12   [provider]    Allergies Patient has no known allergies.  Family History  Problem Relation Age of Onset  . Hypertension Other     Social History Social History   Tobacco Use  . Smoking status: Former Smoker    Packs/day: 1.00    Years: 6.00    Pack years: 6.00    Types: Cigarettes    Last attempt to quit: 01/26/1971    Years since  quitting: 46.3  . Smokeless tobacco: Never Used  Substance Use Topics  . Alcohol use: No  . Drug use: No    Review of Systems Constitutional: No fever/chills does report fatigue Eyes: No visual changes.  No vision ENT: No sore throat. Cardiovascular: Denies chest pain. Respiratory: Denies shortness of breath. Gastrointestinal: No abdominal pain. Genitourinary: Negative for dysuria. Musculoskeletal: Negative for back pain. Skin: Negative for rash. Neurological: Negative for headaches or numbness.    ____________________________________________   PHYSICAL EXAM:  VITAL SIGNS: ED Triage Vitals  Enc Vitals Group     BP 06/02/17 1519 (!) 150/82     Pulse Rate 06/02/17 1519 (!) 51     Resp 06/02/17 1519 18     Temp 06/02/17 1519 98.1 F (36.7 C)     Temp Source 06/02/17 1519 Oral     SpO2 06/02/17 1519 99 %     Weight 06/02/17 1520 160 lb (72.6 kg)     Height 06/02/17 1520 5\' 7"  (1.702 m)     Head Circumference --      Peak Flow --      Pain Score --      Pain Loc --      Pain Edu? --      Excl. in GC? --     Constitutional: Alert and oriented. Well appearing and in no acute distress very pleasant, slightly fatigued in appearance.  Appears mildly ill but in no distress.  Wife at bedside very pleasant. Eyes: Conjunctivae are normal. Head: Atraumatic. Nose: No congestion/rhinnorhea. Mouth/Throat: Mucous membranes are moist. Neck: No stridor.   Cardiovascular: Slightly bradycardic rate, regular rhythm. Grossly normal heart sounds.  Good peripheral circulation. Respiratory: Normal respiratory effort.  No retractions. Lungs CTAB. Gastrointestinal: Soft and nontender. No distention. Musculoskeletal: No lower extremity tenderness nor edema. Neurologic:  Normal speech and language. No gross focal neurologic deficits are appreciated.  Skin:  Skin is warm, dry and intact. No rash noted. Psychiatric: Mood and affect are normal. Speech and behavior are  normal.  ____________________________________________   LABS (all labs ordered are listed, but only abnormal results are displayed)  Labs Reviewed  BASIC METABOLIC PANEL - Abnormal; Notable for the following components:      Result Value   Potassium 2.0 (*)    Chloride 116 (*)    CO2 17 (*)    BUN 37 (*)    Creatinine, Ser 1.26 (*)    Calcium 8.4 (*)    GFR calc non Af Amer 56 (*)    All other components within normal limits  CBC - Abnormal; Notable for the following components:   RBC 4.26 (*)    HCT 39.6 (*)    All other components within normal limits  TROPONIN I - Abnormal; Notable for the  following components:   Troponin I 0.03 (*)    All other components within normal limits  MAGNESIUM  URINALYSIS, COMPLETE (UACMP) WITH MICROSCOPIC   ____________________________________________  EKG  Reviewed interrupt me at 1530 Ventricular rate 50 QRS 160 QTC 430, but a very prominent U wave is noted  Patient appears to be in normal sinus rhythm with frequent PVCs, additionally there is a nonspecific T wave abnormality seen in V5 and V6, but also a prominent U wave is now present that was not present on his previous EKGs.  See no evidence of ischemia ____________________________________________  RADIOLOGY  No results found.  ____________________________________________   PROCEDURES  Procedure(s) performed: None  Procedures  Critical Care performed: Yes, see critical care note(s)  CRITICAL CARE Performed by: Sharyn Creamer   Total critical care time: 35 minutes  Critical care time was exclusive of separately billable procedures and treating other patients.  Critical care was necessary to treat or prevent imminent or life-threatening deterioration.  Critical care was time spent personally by me on the following activities: development of treatment plan with patient and/or surrogate as well as nursing, discussions with consultants, evaluation of patient's response to  treatment, examination of patient, obtaining history from patient or surrogate, ordering and performing treatments and interventions, ordering and review of laboratory studies, ordering and review of radiographic studies, pulse oximetry and re-evaluation of patient's condition.  Patient presents noted to have abnormal EKG tracing now with severe hypo-kalemia.  Felt to be at elevated risk for cardiovascular instability due to severe hypokalemia.  Close monitoring, cardiology consultation called to Dr. Okey Dupre, and admission to hospital. ____________________________________________   INITIAL IMPRESSION / ASSESSMENT AND PLAN / ED COURSE  Pertinent labs & imaging results that were available during my care of the patient were reviewed by me and considered in my medical decision making (see chart for details).  Patient is for evaluation of generalized fatigue and weakness known to have hypo kalemia on previous evaluation, now presenting with worsening of his fatigue.  No focal neurologic symptoms.  Denies any cardiac symptoms but does report feeling quite weak and having problems with walking due to feeling of severe fatigue and lightheadedness.  Muscle weakness.        ____________________________________________   FINAL CLINICAL IMPRESSION(S) / ED DIAGNOSES  Final diagnoses:  Ventricular ectopic activity  Hypokalemia      NEW MEDICATIONS STARTED DURING THIS VISIT:  New Prescriptions   No medications on file     Note:  This document was prepared using Dragon voice recognition software and may include unintentional dictation errors.     Sharyn Creamer, MD 06/02/17 1700

## 2017-06-03 LAB — BASIC METABOLIC PANEL
Anion gap: 7 (ref 5–15)
BUN: 32 mg/dL — ABNORMAL HIGH (ref 6–20)
CO2: 16 mmol/L — ABNORMAL LOW (ref 22–32)
Calcium: 8 mg/dL — ABNORMAL LOW (ref 8.9–10.3)
Chloride: 121 mmol/L — ABNORMAL HIGH (ref 101–111)
Creatinine, Ser: 1.09 mg/dL (ref 0.61–1.24)
GFR calc Af Amer: >60 mL/min (ref >60)
GFR calc non Af Amer: >60 mL/min (ref >60)
Glucose, Bld: 82 mg/dL (ref 65–99)
Potassium: 2.1 mmol/L — CL (ref 3.5–5.1)
Sodium: 144 mmol/L (ref 135–145)

## 2017-06-03 LAB — CBC
HCT: 39.1 % — ABNORMAL LOW (ref 40.0–52.0)
Hemoglobin: 12.9 g/dL — ABNORMAL LOW (ref 13.0–18.0)
MCH: 31.1 pg (ref 26.0–34.0)
MCHC: 33.1 g/dL (ref 32.0–36.0)
MCV: 93.8 fL (ref 80.0–100.0)
Platelets: 205 10*3/uL (ref 150–440)
RBC: 4.17 MIL/uL — ABNORMAL LOW (ref 4.40–5.90)
RDW: 13.3 % (ref 11.5–14.5)
WBC: 7.2 10*3/uL (ref 3.8–10.6)

## 2017-06-03 LAB — GLUCOSE, CAPILLARY
Glucose-Capillary: 62 mg/dL — ABNORMAL LOW (ref 65–99)
Glucose-Capillary: 113 mg/dL — ABNORMAL HIGH (ref 65–99)
Glucose-Capillary: 87 mg/dL (ref 65–99)
Glucose-Capillary: 74 mg/dL (ref 65–99)
Glucose-Capillary: 81 mg/dL (ref 65–99)

## 2017-06-03 LAB — MAGNESIUM: Magnesium: 2.1 mg/dL (ref 1.7–2.4)

## 2017-06-03 LAB — POTASSIUM: Potassium: 3 mmol/L — ABNORMAL LOW (ref 3.5–5.1)

## 2017-06-03 MED ORDER — DONEPEZIL HCL 5 MG PO TABS
10.0000 mg | ORAL_TABLET | Freq: Every day | ORAL | Status: DC
  Administered 2017-06-03: 10 mg via ORAL
  Filled 2017-06-03: qty 1

## 2017-06-03 MED ORDER — SPIRONOLACTONE 25 MG PO TABS
25.0000 mg | ORAL_TABLET | Freq: Every day | ORAL | Status: DC
  Administered 2017-06-03 – 2017-06-04 (×2): 25 mg via ORAL
  Filled 2017-06-03 (×2): qty 1

## 2017-06-03 MED ORDER — POTASSIUM CHLORIDE 20 MEQ PO PACK
40.0000 meq | PACK | ORAL | Status: AC
  Administered 2017-06-03 – 2017-06-04 (×4): 40 meq via ORAL
  Filled 2017-06-03 (×4): qty 2

## 2017-06-03 MED ORDER — FLUOXETINE HCL 20 MG PO CAPS
40.0000 mg | ORAL_CAPSULE | Freq: Every day | ORAL | Status: DC
  Administered 2017-06-03 – 2017-06-04 (×2): 40 mg via ORAL
  Filled 2017-06-03 (×2): qty 2

## 2017-06-03 MED ORDER — POTASSIUM CHLORIDE CRYS ER 20 MEQ PO TBCR
40.0000 meq | EXTENDED_RELEASE_TABLET | ORAL | Status: DC
  Administered 2017-06-03: 40 meq via ORAL
  Filled 2017-06-03: qty 2

## 2017-06-03 NOTE — Consult Note (Signed)
PHARMACY CONSULT NOTE - INITIAL   Pharmacy Consult for Electrolyte monitoring and replacement   Indication: Hypokalemia   Patient Measurements: Height: 5\' 7"  (170.2 cm) Weight: 163 lb 8 oz (74.2 kg) IBW/kg (Calculated) : 66.1  Intake/Output from previous day: 01/15 0701 - 01/16 0700 In: 400 [I.V.:400] Out: 1075 [Urine:1075] Intake/Output from this shift: Total I/O In: 480 [P.O.:480] Out: -   Labs: Recent Labs    06/02/17 1521 06/03/17 0419  WBC 8.2 7.2  HGB 13.3 12.9*  HCT 39.6* 39.1*  PLT 222 205  CREATININE 1.26* 1.09  MG 2.1 2.1   Potassium (mmol/L)  Date Value  06/03/2017 2.1 (LL)  02/06/2015 3.4   Magnesium (mg/dL)  Date Value  02/08/2015 2.1   Calcium (mg/dL)  Date Value  78/46/9629 8.0 (L)   Albumin (g/dL)  Date Value  52/84/1324 3.6   Sodium (mmol/L)  Date Value  06/03/2017 144  ] Estimated Creatinine Clearance: 58.1 mL/min (by C-G formula based on SCr of 1.09 mg/dL).   Assessment: Pharmacy consulted for electrolyte management in 72 yo male admitted with hypokalemia and weakness.   Goal of Therapy:  Electrolytes WNL  Plan:  KCL 62 x 4 po ordered. Patient also receiving NaCl w/KCL @ 75/hr.  F/U K+ lab ordered for 1600. Electrolyte replacement as needed.   , PharmD, BCPS Clinical Pharmacist 06/03/2017 1:48 PM

## 2017-06-03 NOTE — Plan of Care (Signed)
  Progressing Education: Knowledge of General Education information will improve 06/03/2017 0046 - Progressing by Dorna Leitz, RN Elimination: Will not experience complications related to urinary retention 06/03/2017 0046 - Progressing by Dorna Leitz, RN Safety: Ability to remain free from injury will improve 06/03/2017 0046 - Progressing by Dorna Leitz, RN

## 2017-06-03 NOTE — Care Management Note (Signed)
Case Management Note  Patient Details  Name: Brett Lam MRN: [id number] Date of Birth: May 30, 1945  Subjective/Objective:                 Sent to ED by PCP office due to low potassium.  Lives at home with his wife. current with pcp and no issues accessing medical care, transportation or obtaining meds.  Experiencing weakness over last week or so and balance issues for an extended period of time.  Had weight loss surgery in 2016.  K this morning is 2.1.  Action/Plan: Physical therapy consult requested when K level improves   Expected Discharge Date:                  Expected Discharge Plan:     In-House Referral:     Discharge planning Services     Post Acute Care Choice:    Choice offered to:     DME Arranged:    DME Agency:     HH Arranged:    HH Agency:     Status of Service:     If discussed at [employer], dates discussed:    Additional Comments:  Eber Hong, RN 06/03/2017, 9:26 AM

## 2017-06-03 NOTE — Consult Note (Signed)
PHARMACY CONSULT NOTE - INITIAL   Pharmacy Consult for Electrolyte monitoring and replacement   Indication: Hypokalemia   Patient Measurements: Height: 5\' 7"  (170.2 cm) Weight: 163 lb 8 oz (74.2 kg) IBW/kg (Calculated) : 66.1  Intake/Output from previous day: 01/15 0701 - 01/16 0700 In: 400 [I.V.:400] Out: 1075 [Urine:1075] Intake/Output from this shift: Total I/O In: 480 [P.O.:480] Out: -   Labs: Recent Labs    06/02/17 1521 06/03/17 0419  WBC 8.2 7.2  HGB 13.3 12.9*  HCT 39.6* 39.1*  PLT 222 205  CREATININE 1.26* 1.09  MG 2.1 2.1   Potassium (mmol/L)  Date Value  06/03/2017 3.0 (L)  02/06/2015 3.4   Magnesium (mg/dL)  Date Value  02/08/2015 2.1   Calcium (mg/dL)  Date Value  27/10/2374 8.0 (L)   Albumin (g/dL)  Date Value  28/31/5176 3.6   Sodium (mmol/L)  Date Value  06/03/2017 144  ] Estimated Creatinine Clearance: 58.1 mL/min (by C-G formula based on SCr of 1.09 mg/dL).   Assessment: Pharmacy consulted for electrolyte management in 72 yo male admitted with hypokalemia and weakness.   Goal of Therapy:  Electrolytes WNL  Plan:  Repeat K = 3 @ 1600 this evening. Patient has since received KCl 40 mEq @ 1623 and is scheduled to receive another dose this evening.   No additional supplementation needed at this time.  Will recheck electrolytes with AM labs tomorrow.  62, PharmD, BCPS Clinical Pharmacist 06/03/2017 5:45 PM

## 2017-06-03 NOTE — Progress Notes (Signed)
Patient reportedly "cussed out" the unit secretary yesterday, for unknown reasons. Today the patient started yelling out of the room so loud that the pump was beeping that a nurse other than myself decided to just pause the IV fluids until a later time. Will continue to monitor patient's mood. Jari Favre [facility]

## 2017-06-03 NOTE — Progress Notes (Signed)
[employer] [facility] [place] at [facility]   PATIENT NAME: Brett Lam    MR#:  [id number]  DATE OF BIRTH:  15-Jan-1946  SUBJECTIVE:  CHIEF COMPLAINT:   Chief Complaint  Patient presents with  . Weakness  . Abnormal Lab   - feels fine, denies any diarrhea. Potassium still very low at 2.1 today.  REVIEW OF SYSTEMS:  Review of Systems  Constitutional: Negative for chills, fever and malaise/fatigue.  HENT: Negative for congestion, ear discharge, hearing loss and nosebleeds.   Eyes: Negative for blurred vision and double vision.  Respiratory: Negative for cough, shortness of breath and wheezing.   Cardiovascular: Negative for chest pain and palpitations.  Gastrointestinal: Negative for abdominal pain, constipation, diarrhea, nausea and vomiting.  Genitourinary: Negative for dysuria.  Musculoskeletal: Positive for myalgias.  Neurological: Negative for dizziness, sensory change, speech change, focal weakness, seizures and headaches.    DRUG ALLERGIES:  No Known Allergies  VITALS:  Blood pressure (!) 148/78, pulse (!) 50, temperature 98.1 F (36.7 C), temperature source Oral, resp. rate 16, height 5\' 7"  (1.702 m), weight 74.2 kg (163 lb 8 oz), SpO2 100 %.  PHYSICAL EXAMINATION:  Physical Exam  GENERAL:  72 y.o.-year-old patient lying in the bed with no acute distress.  EYES: Pupils equal, round, reactive to light and accommodation. No scleral icterus. Extraocular muscles intact.  HEENT: Head atraumatic, normocephalic. Oropharynx and nasopharynx clear.  NECK:  Supple, no jugular venous distention. No thyroid enlargement, no tenderness.  LUNGS: Normal breath sounds bilaterally, no wheezing, rales,rhonchi or crepitation. No use of accessory muscles of respiration.  CARDIOVASCULAR: S1, S2 normal. No murmurs, rubs, or gallops.  ABDOMEN: Soft, nontender, nondistended. Bowel sounds present. No organomegaly or mass.  EXTREMITIES: No pedal edema, cyanosis, or clubbing.    NEUROLOGIC: Cranial nerves II through XII are intact. Muscle strength 5/5 in all extremities. Sensation intact. Gait not checked.  PSYCHIATRIC: The patient is alert and oriented x 3.  SKIN: No obvious rash, lesion, or ulcer.    LABORATORY PANEL:   CBC Recent Labs  Lab 06/03/17 0419  WBC 7.2  HGB 12.9*  HCT 39.1*  PLT 205   ------------------------------------------------------------------------------------------------------------------  Chemistries  Recent Labs  Lab 06/03/17 0419  NA 144  K 2.1*  CL 121*  CO2 16*  GLUCOSE 82  BUN 32*  CREATININE 1.09  CALCIUM 8.0*  MG 2.1   ------------------------------------------------------------------------------------------------------------------  Cardiac Enzymes Recent Labs  Lab 06/02/17 1521  TROPONINI 0.03*   ------------------------------------------------------------------------------------------------------------------  RADIOLOGY:  No results found.  EKG:   Orders placed or performed during the hospital encounter of 06/02/17  . ED EKG  . ED EKG    ASSESSMENT AND PLAN:   72 year old male with past medical history significant for enlarged prostate, GERD, hypertension, hyperlipidemia, diabetes presents to hospital from PCP office secondary to low potassium.  1. Hypokalemia-has had trouble with the potassium going on for a while. -Takes lisinopril and potassium supplements at home. -Advised adding Aldactone. Aggressive potassium supplementation - Magnesium levels are within normal limits. -Outpatient nephrology follow up if this problem persists  2. Depression-continue Prozac  3. 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 Stable at baseline  4. Hyperlipidemia-statin  5. DVT prophylaxis-subcutaneous heparin   Ambulate prior to discharge   All the records are reviewed and case discussed with Care Management/Social Workerr. Management plans discussed with the patient, family and they are in agreement.  CODE STATUS:  Full code  TOTAL TIME TAKING CARE OF THIS PATIENT: 36 minutes.   POSSIBLE D/C  IN 1-2 DAYS, DEPENDING ON CLINICAL CONDITION.   Enid Baas M.D on 06/03/2017 at 1:25 PM  Between 7am to 6pm - Pager - [phone]  After 6pm go to [email or web address] - Social research officer, government  [facility]  Office  [phone]  CC: Primary care physician; Hillery Aldo, MD

## 2017-06-03 NOTE — Progress Notes (Signed)
AM potassium level 2.1 MD Sheryle Hail made aware. No new orders. Will continue to monitor,   Dorna Leitz

## 2017-06-04 LAB — GLUCOSE, CAPILLARY
Glucose-Capillary: 80 mg/dL (ref 65–99)
Glucose-Capillary: 99 mg/dL (ref 65–99)

## 2017-06-04 LAB — BASIC METABOLIC PANEL
Anion gap: 5 (ref 5–15)
BUN: 28 mg/dL — ABNORMAL HIGH (ref 6–20)
CO2: 15 mmol/L — ABNORMAL LOW (ref 22–32)
Calcium: 8.3 mg/dL — ABNORMAL LOW (ref 8.9–10.3)
Chloride: 120 mmol/L — ABNORMAL HIGH (ref 101–111)
Creatinine, Ser: 0.97 mg/dL (ref 0.61–1.24)
GFR calc Af Amer: >60 mL/min (ref >60)
GFR calc non Af Amer: >60 mL/min (ref >60)
Glucose, Bld: 87 mg/dL (ref 65–99)
Potassium: 3.3 mmol/L — ABNORMAL LOW (ref 3.5–5.1)
Sodium: 140 mmol/L (ref 135–145)

## 2017-06-04 MED ORDER — POTASSIUM CHLORIDE 20 MEQ PO PACK
40.0000 meq | PACK | Freq: Once | ORAL | Status: AC
  Administered 2017-06-04: 40 meq via ORAL
  Filled 2017-06-04: qty 2

## 2017-06-04 MED ORDER — FLUOXETINE HCL 40 MG PO CAPS: 40.0000 mg | ORAL_CAPSULE | Freq: Every day | ORAL | 3 refills | Status: DC

## 2017-06-04 MED ORDER — POTASSIUM CHLORIDE 20 MEQ PO PACK: 20.0000 meq | PACK | Freq: Every day | ORAL | 2 refills | Status: DC

## 2017-06-04 MED ORDER — SPIRONOLACTONE 25 MG PO TABS: 25.0000 mg | ORAL_TABLET | Freq: Every day | ORAL | 2 refills | Status: DC

## 2017-06-04 NOTE — Care Management Important Message (Signed)
Important Message  Patient Details  Name: Brett Lam MRN: [id number] Date of Birth: April 27, 1946   Medicare Important Message Given:  N/A - LOS <3 / Initial given by admissions    Eber Hong, RN 06/04/2017, 11:34 AM

## 2017-06-04 NOTE — Consult Note (Signed)
PHARMACY CONSULT NOTE - INITIAL   Pharmacy Consult for Electrolyte monitoring and replacement   Indication: Hypokalemia   Patient Measurements: Height: 5\' 7"  (170.2 cm) Weight: 163 lb 8 oz (74.2 kg) IBW/kg (Calculated) : 66.1  Intake/Output from previous day: 01/16 0701 - 01/17 0700 In: 720 [P.O.:720] Out: 500 [Urine:500] Intake/Output from this shift: No intake/output data recorded.  Labs: Recent Labs    06/02/17 1521 06/03/17 0419 06/04/17 0626  WBC 8.2 7.2  --   HGB 13.3 12.9*  --   HCT 39.6* 39.1*  --   PLT 222 205  --   CREATININE 1.26* 1.09 0.97  MG 2.1 2.1  --    Potassium (mmol/L)  Date Value  06/04/2017 3.3 (L)  02/06/2015 3.4   Magnesium (mg/dL)  Date Value  02/08/2015 2.1   Calcium (mg/dL)  Date Value  94/49/6759 8.3 (L)   Albumin (g/dL)  Date Value  16/38/4665 3.6   Sodium (mmol/L)  Date Value  06/04/2017 140  ] Estimated Creatinine Clearance: 65.3 mL/min (by C-G formula based on SCr of 0.97 mg/dL).   Assessment: Pharmacy consulted for electrolyte management in 72 yo male admitted with hypokalemia and weakness.   Goal of Therapy:  Electrolytes WNL  Plan:  1/17 AM K = 3.3. Will order KCL 2/17 x 1 dose.   No additional supplementation needed at this time.  Will recheck electrolytes with AM labs tomorrow.  , PharmD, BCPS Clinical Pharmacist 06/04/2017 7:45 AM

## 2017-06-04 NOTE — Discharge Summary (Signed)
[facility] -  at [facility]   PATIENT NAME: Brett Lam    MR#:  [id number]  DATE OF BIRTH:  12/25/1945  DATE OF ADMISSION:  06/02/2017   ADMITTING PHYSICIAN: Shaune Pollack, MD  DATE OF DISCHARGE: 06/04/17  PRIMARY CARE PHYSICIAN: Hillery Aldo, MD   ADMISSION DIAGNOSIS:   Hypokalemia [E87.6] Ventricular ectopic activity [I49.3]  DISCHARGE DIAGNOSIS:   Active Problems:   Hypokalemia   SECONDARY DIAGNOSIS:   Past Medical History:  Diagnosis Date  . Anxiety   . Diabetes mellitus without complication (HCC)    off meds since wt loss  . Dizzy spells   . Elephantiasis   . Elevated PSA   . Enlarged prostate   . Genital herpes   . GERD (gastroesophageal reflux disease)    better since wt loss  . H/O flexible sigmoidoscopy 2002   rectal bleeding   . Hyperlipidemia   . Hypertension   . Osteoarthritis    better since wt loss  . Patient is Jehovah's Witness   . Rectal bleed   . Sleep apnea    cpap - does not need since wt loss  . Urinary frequency   . Venous stasis   . Wears dentures    partial upper and lower    HOSPITAL COURSE:   72 year old male with past medical history significant for enlarged prostate, GERD, hypertension, hyperlipidemia, diabetes presents to hospital from PCP office secondary to low potassium.  1. Hypokalemia- persistent hypokalemia as outpatient. -Improving in the hospital with intervention. Already on lisinopril and potassium supplements at home  - added Aldactone and increased potassium dosage -Better at discharge. - Magnesium levels are within normal limits. -Outpatient nephrology follow up if this problem persists  2. Depression-continue Prozac  3. 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 Stable at baseline  4. Hyperlipidemia-statin  Ambulate and discharge today   DISCHARGE CONDITIONS:   Guarded  CONSULTS OBTAINED:   None  DRUG ALLERGIES:   No Known Allergies DISCHARGE MEDICATIONS:   Allergies as of  06/04/2017   No Known Allergies     Medication List    STOP taking these medications   potassium chloride 10 MEQ tablet Commonly known as:  K-DUR,KLOR-CON     TAKE these medications   atorvastatin 40 MG tablet Commonly known as:  LIPITOR Take 40 mg by mouth daily.   donepezil 10 MG tablet Commonly known as:  ARICEPT Take 10 mg by mouth daily.   FLUoxetine 40 MG capsule Commonly known as:  PROZAC Take 1 capsule (40 mg total) by mouth daily. Start taking on:  06/05/2017 What changed:  how much to take   lisinopril 10 MG tablet Commonly known as:  PRINIVIL,ZESTRIL Take 10 mg by mouth daily.   potassium chloride 20 MEQ packet Commonly known as:  KLOR-CON Take 20 mEq by mouth daily.   spironolactone 25 MG tablet Commonly known as:  ALDACTONE Take 1 tablet (25 mg total) by mouth daily. Start taking on:  06/05/2017   tamsulosin 0.4 MG Caps capsule Commonly known as:  FLOMAX Take 0.8 mg by mouth every morning.        DISCHARGE INSTRUCTIONS:   1. PCP f/u in 1 week for potassium check 2. Nephrology referral as outpatient for persistent hypokalemia  DIET:   Cardiac diet  ACTIVITY:   Activity as tolerated  OXYGEN:   Home Oxygen: No.  Oxygen Delivery: room air  DISCHARGE LOCATION:   home   If you experience worsening of your admission symptoms, develop shortness  of breath, life threatening emergency, suicidal or homicidal thoughts you must seek medical attention immediately by calling 911 or calling your MD immediately  if symptoms less severe.  You Must read complete instructions/literature along with all the possible adverse reactions/side effects for all the Medicines you take and that have been prescribed to you. Take any new Medicines after you have completely understood and accpet all the possible adverse reactions/side effects.   Please note  You were cared for by a hospitalist during your hospital stay. If you have any questions about your discharge  medications or the care you received while you were in the hospital after you are discharged, you can call the unit and asked to speak with the hospitalist on call if the hospitalist that took care of you is not available. Once you are discharged, your primary care physician will handle any further medical issues. Please note that NO REFILLS for any discharge medications will be authorized once you are discharged, as it is imperative that you return to your primary care physician (or establish a relationship with a primary care physician if you do not have one) for your aftercare needs so that they can reassess your need for medications and monitor your lab values.    On the day of Discharge:  VITAL SIGNS:   Blood pressure (!) 153/86, pulse (!) 57, temperature 98.6 F (37 C), temperature source Oral, resp. rate 16, height 5\' 7"  (1.702 m), weight 74.2 kg (163 lb 8 oz), SpO2 99 %.  PHYSICAL EXAMINATION:    GENERAL:  72 y.o.-year-old patient lying in the bed with no acute distress.  EYES: Pupils equal, round, reactive to light and accommodation. No scleral icterus. Extraocular muscles intact.  HEENT: Head atraumatic, normocephalic. Oropharynx and nasopharynx clear.  NECK:  Supple, no jugular venous distention. No thyroid enlargement, no tenderness.  LUNGS: Normal breath sounds bilaterally, no wheezing, rales,rhonchi or crepitation. No use of accessory muscles of respiration.  CARDIOVASCULAR: S1, S2 normal. No murmurs, rubs, or gallops.  ABDOMEN: Soft, nontender, nondistended. Bowel sounds present. No organomegaly or mass.  EXTREMITIES: No pedal edema, cyanosis, or clubbing.  NEUROLOGIC: Cranial nerves II through XII are intact. Muscle strength 5/5 in all extremities. Sensation intact. Gait not checked.  PSYCHIATRIC: The patient is alert and oriented x 3.  SKIN: No obvious rash, lesion, or ulcer.     DATA REVIEW:   CBC Recent Labs  Lab 06/03/17 0419  WBC 7.2  HGB 12.9*  HCT 39.1*  PLT  205    Chemistries  Recent Labs  Lab 06/03/17 0419  06/04/17 0626  NA 144  --  140  K 2.1*   < > 3.3*  CL 121*  --  120*  CO2 16*  --  15*  GLUCOSE 82  --  87  BUN 32*  --  28*  CREATININE 1.09  --  0.97  CALCIUM 8.0*  --  8.3*  MG 2.1  --   --    < > = values in this interval not displayed.     Microbiology Results  Results for orders placed or performed in visit on 12/01/14  Microscopic Examination     Status: None   Collection Time: 12/01/14 11:29 AM  Result Value Ref Range Status   WBC, UA 0-5 0 - 5 /hpf Final   RBC, UA 0-2 0 - 2 /hpf Final   Epithelial Cells (non renal) 0-10 0 - 10 /hpf Final   Bacteria, UA None seen None seen/Few  Final    RADIOLOGY:  No results found.   Management plans discussed with the patient, family and they are in agreement.  CODE STATUS:     Code Status Orders  (From admission, onward)        Start     Ordered   06/02/17 1808  Full code  Continuous     06/02/17 1807    Code Status History    Date Active Date Inactive Code Status Order ID Comments User Context   02/06/2015 18:15 02/08/2015 15:20 Full Code [id number]  Geoffry Paradise, MD Inpatient      TOTAL TIME TAKING CARE OF THIS PATIENT: 38 minutes.    Enid Baas M.D on 06/04/2017 at 10:01 AM  Between 7am to 6pm - Pager - [phone]  After 6pm go to [email or web address] - Social research officer, government  [facility]  Office  [phone]  CC: Primary care physician; Hillery Aldo, MD   Note: This dictation was prepared with Dragon dictation along with smaller phrase technology. Any transcriptional errors that result from this process are unintentional.

## 2017-06-04 NOTE — Plan of Care (Signed)
  Progressing Clinical Measurements: Ability to maintain clinical measurements within normal limits will improve 06/04/2017 1125 - Progressing by Myles Gip, RN Pain Managment: General experience of comfort will improve 06/04/2017 1125 - Progressing by Myles Gip, RN Safety: Ability to remain free from injury will improve 06/04/2017 1125 - Progressing by Myles Gip, RN

## 2017-06-04 NOTE — Evaluation (Signed)
Physical Therapy Evaluation Patient Details Name: Brett Lam MRN: [id number] DOB: 1946-01-13 Today's Date: 06/04/2017   History of Present Illness  Pt admitted for hypokalemia. Pt with complaints of weakness and balance problems. History includes anxiety, DM, and GERD. Pt now reports he is feeling better, back to baseline, and anxious to dc today. K+ at 3.3 this date.  Clinical Impression  Pt is a pleasant 72 year old male who was admitted for hypokalemia. Pt demonstrates all bed mobility/transfers/ambulation at baseline level. No unsteadiness noted. Discussed with pt who reports he is back to baseline level, feels safe to dc home. Pt does not require any further PT needs at this time. Pt will be dc in house and does not require follow up. RN aware. Will dc current orders.      Follow Up Recommendations No PT follow up    Equipment Recommendations  None recommended by PT    Recommendations for Other Services       Precautions / Restrictions Precautions Precautions: Fall Restrictions Weight Bearing Restrictions: No      Mobility  Bed Mobility Overal bed mobility: Independent             General bed mobility comments: safe technique performed  Transfers Overall transfer level: Independent Equipment used: None             General transfer comment: upright posture noted, safe  Ambulation/Gait Ambulation/Gait assistance: Supervision Ambulation Distance (Feet): 40 Feet Assistive device: None Gait Pattern/deviations: WFL(Within Functional Limits)     General Gait Details: only wished to stay in room at this time. Reciprocal gait pattern, slow speed. Witnessed Pt ambulating later in morning with tech in hallway with supervision, no AD required  Stairs            Wheelchair Mobility    Modified Rankin (Stroke Patients Only)       Balance Overall balance assessment: Independent                                            Pertinent Vitals/Pain Pain Assessment: No/denies pain    Home Living Family/patient expects to be discharged to:: Private residence Living Arrangements: Spouse/significant other Available Help at Discharge: Family Type of Home: House Home Access: Stairs to enter Entrance Stairs-Rails: Can reach both Entrance Stairs-Number of Steps: 3 Home Layout: One level Home Equipment: None      Prior Function Level of Independence: Independent         Comments: reports no falls recently     Hand Dominance        Extremity/Trunk Assessment   Upper Extremity Assessment Upper Extremity Assessment: Overall WFL for tasks assessed    Lower Extremity Assessment Lower Extremity Assessment: Overall WFL for tasks assessed       Communication   Communication: No difficulties  Cognition Arousal/Alertness: Awake/alert Behavior During Therapy: WFL for tasks assessed/performed Overall Cognitive Status: Within Functional Limits for tasks assessed                                        General Comments      Exercises     Assessment/Plan    PT Assessment Patent does not need any further PT services  PT Problem List  PT Treatment Interventions      PT Goals (Current goals can be found in the Care Plan section)  Acute Rehab PT Goals Patient Stated Goal: to go home today PT Goal Formulation: All assessment and education complete, DC therapy Time For Goal Achievement: 06/04/17 Potential to Achieve Goals: Good    Frequency     Barriers to discharge        Co-evaluation               AM-PAC PT "6 Clicks" Daily Activity  Outcome Measure Difficulty turning over in bed (including adjusting bedclothes, sheets and blankets)?: None Difficulty moving from lying on back to sitting on the side of the bed? : None Difficulty sitting down on and standing up from a chair with arms (e.g., wheelchair, bedside commode, etc,.)?: None Help needed moving  to and from a bed to chair (including a wheelchair)?: None Help needed walking in hospital room?: None Help needed climbing 3-5 steps with a railing? : A Little 6 Click Score: 23    End of Session Equipment Utilized During Treatment: Gait belt Activity Tolerance: Patient tolerated treatment well Patient left: in chair;with chair alarm set Nurse Communication: Mobility status PT Visit Diagnosis: Muscle weakness (generalized) (M62.81)    Time: 8546-2703 PT Time Calculation (min) (ACUTE ONLY): 9 min   Charges:   PT Evaluation $PT Eval Low Complexity: 1 Low     PT G CodesElizabeth Lam, PT, DPT [phone]   Brett Lam 06/04/2017, 11:06 AM

## 2017-06-04 NOTE — Progress Notes (Signed)
Pt d/c to home today.  IV removed intact.  D/c paperwork printed and reviewed w/pt.  All medication questions and concerns reviewed and pt states understanding.  All Rx's given to patient. Pt requested to wheelchaired out for d/c.    

## 2017-06-04 NOTE — Progress Notes (Addendum)
Pt refused bed alarm but was educated about safety. 

## 2017-09-08 ENCOUNTER — Encounter: Payer: Self-pay | Admitting: Emergency Medicine

## 2017-09-08 ENCOUNTER — Emergency Department: Payer: Medicare HMO

## 2017-09-08 ENCOUNTER — Other Ambulatory Visit: Payer: Self-pay

## 2017-09-08 ENCOUNTER — Emergency Department
Admission: EM | Admit: 2017-09-08 | Discharge: 2017-09-08 | Disposition: A | Discharge status: Home / Self Care | Payer: Medicare HMO | Attending: Emergency Medicine | Admitting: Emergency Medicine

## 2017-09-08 DIAGNOSIS — S42212A Unspecified displaced fracture of surgical neck of left humerus, initial encounter for closed fracture: Secondary | ICD-10-CM | POA: Diagnosis not present

## 2017-09-08 DIAGNOSIS — Y999 Unspecified external cause status: Secondary | ICD-10-CM | POA: Diagnosis not present

## 2017-09-08 DIAGNOSIS — S4992XA Unspecified injury of left shoulder and upper arm, initial encounter: Secondary | ICD-10-CM | POA: Diagnosis present

## 2017-09-08 DIAGNOSIS — Y929 Unspecified place or not applicable: Secondary | ICD-10-CM | POA: Diagnosis not present

## 2017-09-08 DIAGNOSIS — Z87891 Personal history of nicotine dependence: Secondary | ICD-10-CM | POA: Insufficient documentation

## 2017-09-08 DIAGNOSIS — I10 Essential (primary) hypertension: Secondary | ICD-10-CM | POA: Insufficient documentation

## 2017-09-08 DIAGNOSIS — W010XXA Fall on same level from slipping, tripping and stumbling without subsequent striking against object, initial encounter: Secondary | ICD-10-CM | POA: Diagnosis not present

## 2017-09-08 DIAGNOSIS — Y9389 Activity, other specified: Secondary | ICD-10-CM | POA: Diagnosis not present

## 2017-09-08 DIAGNOSIS — Z79899 Other long term (current) drug therapy: Secondary | ICD-10-CM | POA: Diagnosis not present

## 2017-09-08 DIAGNOSIS — E119 Type 2 diabetes mellitus without complications: Secondary | ICD-10-CM | POA: Diagnosis not present

## 2017-09-08 MED ORDER — OXYCODONE-ACETAMINOPHEN 5-325 MG PO TABS: 1.0000 | ORAL_TABLET | Freq: Four times a day (QID) | ORAL | 0 refills | Status: DC | PRN

## 2017-09-08 MED ORDER — DOCUSATE SODIUM 100 MG PO CAPS: ORAL_CAPSULE | ORAL | 0 refills | Status: DC

## 2017-09-08 MED ORDER — OXYCODONE-ACETAMINOPHEN 5-325 MG PO TABS
2.0000 | ORAL_TABLET | Freq: Once | ORAL | Status: AC
  Administered 2017-09-08: 2 via ORAL
  Filled 2017-09-08: qty 2

## 2017-09-08 NOTE — ED Notes (Signed)

## 2017-09-08 NOTE — ED Provider Notes (Signed)
[facility] Emergency Department Provider Note  ____________________________________________   First MD Initiated Contact with Patient 09/08/17 [phone]     (approximate)  I have reviewed the triage vital signs and the nursing notes.   HISTORY  Chief Complaint Shoulder Injury    HPI Brett Lam is a 72 y.o. male with medical history as listed below who presents for evaluation of acute onset and severe pain in his left upper arm after a fall.  He reports that he got up to go the kitchen when he stumbled and lost his balance and fell, landing on his outstretched left arm.  There is immediate and severe sharp and aching pain.  The pain is mild at rest but severe with any kind of movement.  He has no numbness or tingling distally.  There is no significant swelling.  He did not strike his head or neck and has no pain anywhere other than the left arm.  He did not lose consciousness.  He is on no blood thinners.  He has no pre-existing relationship with an orthopedic surgeon.  Past Medical History:  Diagnosis Date  . Anxiety   . Diabetes mellitus without complication (HCC)    off meds since wt loss  . Dizzy spells   . Elephantiasis   . Elevated PSA   . Enlarged prostate   . Genital herpes   . GERD (gastroesophageal reflux disease)    better since wt loss  . H/O flexible sigmoidoscopy 2002   rectal bleeding   . Hyperlipidemia   . Hypertension   . Osteoarthritis    better since wt loss  . Patient is Jehovah's Witness   . Rectal bleed   . Sleep apnea    cpap - does not need since wt loss  . Urinary frequency   . Venous stasis   . Wears dentures    partial upper and lower    Patient Active Problem List   Diagnosis Date Noted  . Hypokalemia 06/02/2017  . Personal history of colonic polyps   . First degree hemorrhoids   . Chest tightness 09/28/2012  . Morbid obesity (HCC) 09/28/2012  . Sleep apnea 09/28/2012  . HTN (hypertension) 09/28/2012  .  Hyperlipidemia 09/28/2012  . Essential (primary) hypertension 09/28/2012  . 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 COLONOSCOPY    . COLONOSCOPY WITH PROPOFOL N/A 07/23/2015   Procedure: COLONOSCOPY WITH PROPOFOL;  Surgeon: Midge Minium, MD;  Location: [facility];  Service: Endoscopy;  Laterality: N/A;  . LAPAROSCOPIC GASTRIC RESTRICTIVE DUODENAL PROCEDURE (DUODENAL SWITCH) N/A 02/06/2015   Procedure: LAPAROSCOPIC GASTRIC RESTRICTIVE DUODENAL PROCEDURE (DUODENAL SWITCH);  Surgeon: Geoffry Paradise, MD;  Location: [facility];  Service: General;  Laterality: N/A;  . LIPOMA EXCISION  2004  . VEIN SURGERY Bilateral    legs    Prior to Admission medications   Medication Sig Start Date End Date Taking? Authorizing Provider  atorvastatin (LIPITOR) 40 MG tablet Take 40 mg by mouth daily.    [provider]  docusate sodium (COLACE) 100 MG capsule Take 1 tablet once or twice daily as needed for constipation while taking narcotic pain medicine 09/08/17   Loleta Rose, MD  donepezil (ARICEPT) 10 MG tablet Take 10 mg by mouth daily.    [provider]  FLUoxetine (PROZAC) 40 MG capsule Take 1 capsule (40 mg total) by mouth daily. 06/05/17   Enid Baas, MD  lisinopril (PRINIVIL,ZESTRIL)  10 MG tablet Take 10 mg by mouth daily.    [provider]  oxyCODONE-acetaminophen (PERCOCET) 5-325 MG tablet Take 1-2 tablets by mouth every 6 (six) hours as needed for severe pain. 09/08/17   Loleta Rose, MD  potassium chloride (KLOR-CON) 20 MEQ packet Take 20 mEq by mouth daily. 06/04/17   Enid Baas, MD  spironolactone (ALDACTONE) 25 MG tablet Take 1 tablet (25 mg total) by mouth daily. 06/05/17   Enid Baas, MD  tamsulosin (FLOMAX) 0.4 MG CAPS Take 0.8 mg by mouth every morning.  08/04/12   [provider]    Allergies Patient has no known allergies.  Family History  Problem  Relation Age of Onset  . Hypertension Other     Social History Social History   Tobacco Use  . Smoking status: Former Smoker    Packs/day: 1.00    Years: 6.00    Pack years: 6.00    Types: Cigarettes    Last attempt to quit: 01/26/1971    Years since quitting: 46.6  . Smokeless tobacco: Never Used  Substance Use Topics  . Alcohol use: No  . Drug use: No    Review of Systems Constitutional: No fever/chills Eyes: No visual changes. Cardiovascular: Denies chest pain. Respiratory: Denies shortness of breath. Gastrointestinal: No abdominal pain.   Musculoskeletal: Severe pain in left upper arm as described above  ____________________________________________   PHYSICAL EXAM:  VITAL SIGNS: ED Triage Vitals  Enc Vitals Group     BP 09/08/17 0203 (!) 165/87     Pulse Rate 09/08/17 0203 83     Resp 09/08/17 0203 18     Temp 09/08/17 0203 97.7 F (36.5 C)     Temp Source 09/08/17 0203 Oral     SpO2 09/08/17 0203 100 %     Weight 09/08/17 0201 77.1 kg (170 lb)     Height 09/08/17 0201 1.753 m (5\' 9" )     Head Circumference --      Peak Flow --      Pain Score --      Pain Loc --      Pain Edu? --      Excl. in GC? --     Constitutional: Alert and oriented. Well appearing and in no acute distress. Head: Atraumatic. Neck: No stridor.  No meningeal signs.  No cervical spine tenderness to palpation. Cardiovascular: Normal rate, regular rhythm.  Respiratory: Normal respiratory effort.  No retractions.  Musculoskeletal: Pain and tenderness with some swelling in the left upper arm consistent with the proximal humeral neck fracture seen on radiographs.  Neurovascularly intact distally.  Able to flex and extend elbow and wrist and move all fingers without any difficulty Neurologic:  Normal speech and language. No gross focal neurologic deficits are appreciated.  Skin:  Skin is warm, dry and intact. No rash noted. Psychiatric: Mood and affect are normal. Speech and behavior are  normal.  ____________________________________________   LABS (all labs ordered are listed, but only abnormal results are displayed)  Labs Reviewed - No data to display ____________________________________________  EKG  None - EKG not ordered by ED physician ____________________________________________  RADIOLOGY Marylou Mccoy, personally viewed and evaluated these images (plain radiographs) as part of my medical decision making, as well as reviewing the written report by the radiologist.  ED MD interpretation: Humeral neck fracture on the left side  Official radiology report(s): Dg Shoulder Left  Result Date: 09/08/2017 CLINICAL DATA:  Status post fall, with left shoulder pain.  Initial encounter. EXAM: LEFT SHOULDER - 2+ VIEW COMPARISON:  None. FINDINGS: There is a mildly displaced fracture through the surgical neck of the left humerus. Given only mild displacement, this is compatible with a Neer 1 part fracture. The left humeral head is seated within the glenoid fossa. Deformity of the distal left clavicle is thought to be chronic in nature, though new from 2016. No significant soft tissue abnormalities are seen. The visualized portions of the left lung are clear. IMPRESSION: 1. Mildly displaced fracture through the surgical neck of the left humerus. 2. Deformity of the distal left clavicle is thought to be chronic in nature, though new from 2016. Electronically Signed   By: Roanna Raider M.D.   On: 09/08/2017 02:52    ____________________________________________   PROCEDURES  Critical Care performed: No   Procedure(s) performed:   Procedures   ____________________________________________   INITIAL IMPRESSION / ASSESSMENT AND PLAN / ED COURSE  As part of my medical decision making, I reviewed the following data within the electronic MEDICAL RECORD NUMBER Nursing notes reviewed and incorporated and Radiograph reviewed , discussed case with orthopedic surgery (Dr.  Hyacinth Meeker)    Patient has a left humeral neck fracture that is mildly displaced.  He is neurovascularly intact.  He has no evidence of any other injuries.  I discussed the case with Dr. Hyacinth Meeker by phone who recommended a shoulder immobilizer and outpatient follow-up within the next 3 days and orthopedics clinic.  I checked the [place] controlled substance database and he has no records in the database.  Given the nature of this injury which will be quite painful for some time, I prescribed Percocet as well as Colace and give him my usual and customary return precautions both for the injury as well as for the medication.  He understands and agrees with the plan.  He was given 2 Percocet in the emergency department for pain control.     ____________________________________________  FINAL CLINICAL IMPRESSION(S) / ED DIAGNOSES  Final diagnoses:  Closed displaced fracture of surgical neck of left humerus, unspecified fracture morphology, initial encounter     MEDICATIONS GIVEN DURING THIS VISIT:  Medications  oxyCODONE-acetaminophen (PERCOCET/ROXICET) 5-325 MG per tablet 2 tablet (2 tablets Oral Given 09/08/17 0702)     ED Discharge Orders        Ordered    oxyCODONE-acetaminophen (PERCOCET) 5-325 MG tablet  Every 6 hours PRN     09/08/17 0650    docusate sodium (COLACE) 100 MG capsule     09/08/17 0650       Note:  This document was prepared using Dragon voice recognition software and may include unintentional dictation errors.    Loleta Rose, MD 09/08/17 [phone]

## 2017-09-08 NOTE — Discharge Instructions (Signed)
As we discussed, try to use your shoulder immobilizer as much as possible.  As indicated, please call the office of Dr. Hyacinth Meeker to schedule a follow-up appointment by the end of this week or early next week at the latest.  Use over-the-counter pain medicine for mild pain, but take Percocet as prescribed for severe pain. Do not drink alcohol, drive or participate in any other potentially dangerous activities while taking this medication as it may make you sleepy. Do not take this medication with any other sedating medications, either prescription or over-the-counter. If you were prescribed Percocet or Vicodin, do not take these with acetaminophen (Tylenol) as it is already contained within these medications.   This medication is an opiate (or narcotic) pain medication and can be habit forming.  Use it as little as possible to achieve adequate pain control.  Do not use or use it with extreme caution if you have a history of opiate abuse or dependence.  If you are on a pain contract with your primary care doctor or a pain specialist, be sure to let them know you were prescribed this medication today from the [facility] Emergency Department.  This medication is intended for your use only - do not give any to anyone else and keep it in a secure place where nobody else, especially children, have access to it.  It will also cause or worsen constipation, so you may want to consider taking an over-the-counter stool softener while you are taking this medication.  Return to the emergency department if you develop new or worsening symptoms that concern you.

## 2017-09-08 NOTE — ED Triage Notes (Signed)
Pt to triage via w/c, appears uncomfortable, brought in by EMS; st lost balance getting up to go to kitchen and fell landing on left shoulder; c/o persistent pain with any movement or palpation; sling applied; pt denies any other c/o or injuries

## 2017-09-19 ENCOUNTER — Emergency Department: Payer: Medicare (Managed Care)

## 2017-09-19 ENCOUNTER — Observation Stay
Admission: EM | Admit: 2017-09-19 | Discharge: 2017-09-20 | Disposition: A | Discharge status: Home / Self Care | Payer: Medicare (Managed Care) | Attending: Internal Medicine | Admitting: Internal Medicine

## 2017-09-19 ENCOUNTER — Other Ambulatory Visit: Payer: Self-pay

## 2017-09-19 DIAGNOSIS — E119 Type 2 diabetes mellitus without complications: Secondary | ICD-10-CM | POA: Diagnosis not present

## 2017-09-19 DIAGNOSIS — M1990 Unspecified osteoarthritis, unspecified site: Secondary | ICD-10-CM | POA: Insufficient documentation

## 2017-09-19 DIAGNOSIS — G4730 Sleep apnea, unspecified: Secondary | ICD-10-CM | POA: Insufficient documentation

## 2017-09-19 DIAGNOSIS — K219 Gastro-esophageal reflux disease without esophagitis: Secondary | ICD-10-CM | POA: Diagnosis not present

## 2017-09-19 DIAGNOSIS — S42215D Unspecified nondisplaced fracture of surgical neck of left humerus, subsequent encounter for fracture with routine healing: Secondary | ICD-10-CM

## 2017-09-19 DIAGNOSIS — I82A12 Acute embolism and thrombosis of left axillary vein: Secondary | ICD-10-CM | POA: Diagnosis present

## 2017-09-19 DIAGNOSIS — N401 Enlarged prostate with lower urinary tract symptoms: Secondary | ICD-10-CM | POA: Insufficient documentation

## 2017-09-19 DIAGNOSIS — F0390 Unspecified dementia without behavioral disturbance: Secondary | ICD-10-CM | POA: Diagnosis not present

## 2017-09-19 DIAGNOSIS — I82409 Acute embolism and thrombosis of unspecified deep veins of unspecified lower extremity: Secondary | ICD-10-CM | POA: Diagnosis not present

## 2017-09-19 DIAGNOSIS — I82622 Acute embolism and thrombosis of deep veins of left upper extremity: Secondary | ICD-10-CM | POA: Diagnosis not present

## 2017-09-19 DIAGNOSIS — Z79899 Other long term (current) drug therapy: Secondary | ICD-10-CM | POA: Insufficient documentation

## 2017-09-19 DIAGNOSIS — F419 Anxiety disorder, unspecified: Secondary | ICD-10-CM | POA: Diagnosis not present

## 2017-09-19 DIAGNOSIS — Z87891 Personal history of nicotine dependence: Secondary | ICD-10-CM | POA: Diagnosis not present

## 2017-09-19 DIAGNOSIS — O2230 Deep phlebothrombosis in pregnancy, unspecified trimester: Secondary | ICD-10-CM | POA: Diagnosis present

## 2017-09-19 DIAGNOSIS — E875 Hyperkalemia: Secondary | ICD-10-CM

## 2017-09-19 DIAGNOSIS — M25512 Pain in left shoulder: Secondary | ICD-10-CM | POA: Diagnosis present

## 2017-09-19 DIAGNOSIS — I10 Essential (primary) hypertension: Secondary | ICD-10-CM | POA: Insufficient documentation

## 2017-09-19 DIAGNOSIS — W1830XD Fall on same level, unspecified, subsequent encounter: Secondary | ICD-10-CM | POA: Diagnosis not present

## 2017-09-19 DIAGNOSIS — E876 Hypokalemia: Principal | ICD-10-CM | POA: Insufficient documentation

## 2017-09-19 DIAGNOSIS — E785 Hyperlipidemia, unspecified: Secondary | ICD-10-CM | POA: Diagnosis not present

## 2017-09-19 DIAGNOSIS — R296 Repeated falls: Secondary | ICD-10-CM | POA: Diagnosis not present

## 2017-09-19 DIAGNOSIS — S42225D 2-part nondisplaced fracture of surgical neck of left humerus, subsequent encounter for fracture with routine healing: Secondary | ICD-10-CM | POA: Diagnosis not present

## 2017-09-19 LAB — BASIC METABOLIC PANEL
Anion gap: 6 (ref 5–15)
BUN: 35 mg/dL — ABNORMAL HIGH (ref 6–20)
CO2: 18 mmol/L — ABNORMAL LOW (ref 22–32)
Calcium: 8.3 mg/dL — ABNORMAL LOW (ref 8.9–10.3)
Chloride: 117 mmol/L — ABNORMAL HIGH (ref 101–111)
Creatinine, Ser: 1.19 mg/dL (ref 0.61–1.24)
GFR calc Af Amer: >60 mL/min (ref >60)
GFR calc non Af Amer: 59 mL/min — ABNORMAL LOW (ref >60)
Glucose, Bld: 75 mg/dL (ref 65–99)
Potassium: 2.4 mmol/L — CL (ref 3.5–5.1)
Sodium: 141 mmol/L (ref 135–145)

## 2017-09-19 LAB — HEPARIN LEVEL (UNFRACTIONATED): Heparin Unfractionated: 0.24 IU/mL — ABNORMAL LOW (ref 0.30–0.70)

## 2017-09-19 LAB — CBC WITH DIFFERENTIAL/PLATELET
Basophils Absolute: 0 10*3/uL (ref 0–0.1)
Basophils Relative: 0 %
Eosinophils Absolute: 0.1 10*3/uL (ref 0–0.7)
Eosinophils Relative: 1 %
HCT: 36.6 % — ABNORMAL LOW (ref 40.0–52.0)
Hemoglobin: 12.4 g/dL — ABNORMAL LOW (ref 13.0–18.0)
Lymphocytes Relative: 38 %
Lymphs Abs: 2.9 10*3/uL (ref 1.0–3.6)
MCH: 31.8 pg (ref 26.0–34.0)
MCHC: 33.8 g/dL (ref 32.0–36.0)
MCV: 94 fL (ref 80.0–100.0)
Monocytes Absolute: 0.7 10*3/uL (ref 0.2–1.0)
Monocytes Relative: 9 %
Neutro Abs: 3.9 10*3/uL (ref 1.4–6.5)
Neutrophils Relative %: 52 %
Platelets: 245 10*3/uL (ref 150–440)
RBC: 3.89 MIL/uL — ABNORMAL LOW (ref 4.40–5.90)
RDW: 14.1 % (ref 11.5–14.5)
WBC: 7.6 10*3/uL (ref 3.8–10.6)

## 2017-09-19 LAB — POTASSIUM: Potassium: 3.3 mmol/L — ABNORMAL LOW (ref 3.5–5.1)

## 2017-09-19 LAB — PROTIME-INR
INR: 1.06
Prothrombin Time: 13.7 seconds (ref 11.4–15.2)

## 2017-09-19 LAB — APTT: aPTT: 33 seconds (ref 24–36)

## 2017-09-19 LAB — TSH: TSH: 9.083 u[IU]/mL — ABNORMAL HIGH (ref 0.350–4.500)

## 2017-09-19 MED ORDER — POTASSIUM CHLORIDE 20 MEQ PO PACK
60.0000 meq | PACK | Freq: Once | ORAL | Status: AC
  Administered 2017-09-19: 60 meq via ORAL
  Filled 2017-09-19: qty 3

## 2017-09-19 MED ORDER — LISINOPRIL 10 MG PO TABS
10.0000 mg | ORAL_TABLET | Freq: Every day | ORAL | Status: DC
  Administered 2017-09-19 – 2017-09-20 (×2): 10 mg via ORAL
  Filled 2017-09-19 (×2): qty 1

## 2017-09-19 MED ORDER — HEPARIN (PORCINE) IN NACL 100-0.45 UNIT/ML-% IJ SOLN
1500.0000 [IU]/h | INTRAMUSCULAR | Status: DC
  Administered 2017-09-19: 1200 [IU]/h via INTRAVENOUS
  Administered 2017-09-20: 1350 [IU]/h via INTRAVENOUS
  Filled 2017-09-19 (×2): qty 250

## 2017-09-19 MED ORDER — POTASSIUM CHLORIDE IN NACL 20-0.9 MEQ/L-% IV SOLN
INTRAVENOUS | Status: DC
  Administered 2017-09-19 (×2): via INTRAVENOUS
  Filled 2017-09-19 (×3): qty 1000

## 2017-09-19 MED ORDER — ONDANSETRON HCL 4 MG/2ML IJ SOLN: 4.0000 mg | Freq: Four times a day (QID) | INTRAMUSCULAR | Status: DC | PRN

## 2017-09-19 MED ORDER — POTASSIUM CHLORIDE CRYS ER 20 MEQ PO TBCR
20.0000 meq | EXTENDED_RELEASE_TABLET | Freq: Every day | ORAL | Status: DC
  Administered 2017-09-19: 20 meq via ORAL
  Filled 2017-09-19: qty 1

## 2017-09-19 MED ORDER — SPIRONOLACTONE 25 MG PO TABS
25.0000 mg | ORAL_TABLET | Freq: Every day | ORAL | Status: DC
  Administered 2017-09-19 – 2017-09-20 (×2): 25 mg via ORAL
  Filled 2017-09-19 (×2): qty 1

## 2017-09-19 MED ORDER — MORPHINE SULFATE (PF) 4 MG/ML IV SOLN
4.0000 mg | Freq: Once | INTRAVENOUS | Status: DC
  Filled 2017-09-19: qty 1

## 2017-09-19 MED ORDER — ONDANSETRON HCL 4 MG PO TABS
4.0000 mg | ORAL_TABLET | Freq: Four times a day (QID) | ORAL | Status: DC | PRN
Start: 2017-09-19 — End: 2017-09-20

## 2017-09-19 MED ORDER — HEPARIN (PORCINE) IN NACL 100-0.45 UNIT/ML-% IJ SOLN: 14.0000 [IU]/kg/h | Freq: Once | INTRAMUSCULAR | Status: DC

## 2017-09-19 MED ORDER — TAMSULOSIN HCL 0.4 MG PO CAPS
0.8000 mg | ORAL_CAPSULE | ORAL | Status: DC
  Administered 2017-09-20: 0.8 mg via ORAL
  Filled 2017-09-19: qty 2

## 2017-09-19 MED ORDER — HEPARIN SODIUM (PORCINE) 5000 UNIT/ML IJ SOLN: 4000.0000 [IU] | Freq: Once | INTRAMUSCULAR | Status: DC

## 2017-09-19 MED ORDER — MORPHINE SULFATE (PF) 4 MG/ML IV SOLN
4.0000 mg | Freq: Once | INTRAVENOUS | Status: AC
  Administered 2017-09-19: 4 mg via INTRAVENOUS

## 2017-09-19 MED ORDER — DOCUSATE SODIUM 100 MG PO CAPS
100.0000 mg | ORAL_CAPSULE | Freq: Two times a day (BID) | ORAL | Status: DC
  Administered 2017-09-19: 100 mg via ORAL
  Filled 2017-09-19: qty 1

## 2017-09-19 MED ORDER — ACETAMINOPHEN 650 MG RE SUPP: 650.0000 mg | Freq: Four times a day (QID) | RECTAL | Status: DC | PRN

## 2017-09-19 MED ORDER — ONDANSETRON 4 MG PO TBDP
4.0000 mg | ORAL_TABLET | Freq: Once | ORAL | Status: AC
  Administered 2017-09-19: 4 mg via ORAL
  Filled 2017-09-19: qty 1

## 2017-09-19 MED ORDER — DONEPEZIL HCL 5 MG PO TABS
10.0000 mg | ORAL_TABLET | Freq: Every day | ORAL | Status: DC
  Administered 2017-09-19 – 2017-09-20 (×2): 10 mg via ORAL
  Filled 2017-09-19 (×2): qty 2

## 2017-09-19 MED ORDER — AMLODIPINE BESYLATE 5 MG PO TABS
5.0000 mg | ORAL_TABLET | Freq: Every day | ORAL | Status: DC
  Administered 2017-09-19 – 2017-09-20 (×2): 5 mg via ORAL
  Filled 2017-09-19 (×2): qty 1

## 2017-09-19 MED ORDER — ACETAMINOPHEN 325 MG PO TABS
650.0000 mg | ORAL_TABLET | Freq: Four times a day (QID) | ORAL | Status: DC | PRN
Start: 2017-09-19 — End: 2017-09-20

## 2017-09-19 MED ORDER — OXYCODONE-ACETAMINOPHEN 5-325 MG PO TABS
1.0000 | ORAL_TABLET | Freq: Four times a day (QID) | ORAL | Status: DC | PRN
  Administered 2017-09-19 – 2017-09-20 (×3): 2 via ORAL
  Filled 2017-09-19 (×3): qty 2

## 2017-09-19 MED ORDER — FLUOXETINE HCL 20 MG PO CAPS
40.0000 mg | ORAL_CAPSULE | Freq: Every day | ORAL | Status: DC
  Administered 2017-09-19 – 2017-09-20 (×2): 40 mg via ORAL
  Filled 2017-09-19 (×2): qty 2

## 2017-09-19 MED ORDER — ATORVASTATIN CALCIUM 20 MG PO TABS
40.0000 mg | ORAL_TABLET | Freq: Every day | ORAL | Status: DC
  Administered 2017-09-19 – 2017-09-20 (×2): 40 mg via ORAL
  Filled 2017-09-19 (×2): qty 2

## 2017-09-19 MED ORDER — POTASSIUM CHLORIDE CRYS ER 20 MEQ PO TBCR
60.0000 meq | EXTENDED_RELEASE_TABLET | Freq: Once | ORAL | Status: DC
  Filled 2017-09-19: qty 3

## 2017-09-19 MED ORDER — HEPARIN BOLUS VIA INFUSION
4600.0000 [IU] | Freq: Once | INTRAVENOUS | Status: AC
  Administered 2017-09-19: 4600 [IU] via INTRAVENOUS
  Filled 2017-09-19: qty 4600

## 2017-09-19 NOTE — ED Provider Notes (Signed)
[facility] Emergency Department Provider Note   ____________________________________________   First MD Initiated Contact with Patient 09/19/17 [phone]     (approximate)  I have reviewed the triage vital signs and the nursing notes.   HISTORY  Chief Complaint Fall    HPI Brett Lam is a 72 y.o. male brought to the ED from home via EMS with a chief complaint of left shoulder pain.  Patient with recent left humerus fracture, seen and treated in the ED on 09/08/2017.  This morning patient states he over reach for something, lost his balance and fell onto his left shoulder.  Complains of increased pain to his left shoulder.  Denies associated extremity weakness, numbness or tingling.  States he has been wearing shoulder immobilizer as instructed.  Noted left hand and forearm swelling over the past several days.  Has not yet followed up with orthopedics.  Denies striking head or LOC.  Denies use of anticoagulants.  Denies associated headache, vision changes, neck pain, chest pain, shortness of breath, abdominal pain, nausea or vomiting.   Past Medical History:  Diagnosis Date  . Anxiety   . Diabetes mellitus without complication (HCC)    off meds since wt loss  . Dizzy spells   . Elephantiasis   . Elevated PSA   . Enlarged prostate   . Genital herpes   . GERD (gastroesophageal reflux disease)    better since wt loss  . H/O flexible sigmoidoscopy 2002   rectal bleeding   . Hyperlipidemia   . Hypertension   . Osteoarthritis    better since wt loss  . Patient is Jehovah's Witness   . Rectal bleed   . Sleep apnea    cpap - does not need since wt loss  . Urinary frequency   . Venous stasis   . Wears dentures    partial upper and lower    Patient Active Problem List   Diagnosis Date Noted  . Hypokalemia 06/02/2017  . Personal history of colonic polyps   . First degree hemorrhoids   . Chest tightness 09/28/2012  . Morbid obesity (HCC) 09/28/2012   . Sleep apnea 09/28/2012  . HTN (hypertension) 09/28/2012  . Hyperlipidemia 09/28/2012  . Essential (primary) hypertension 09/28/2012  . 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 COLONOSCOPY    . COLONOSCOPY WITH PROPOFOL N/A 07/23/2015   Procedure: COLONOSCOPY WITH PROPOFOL;  Surgeon: Midge Minium, MD;  Location: [facility];  Service: Endoscopy;  Laterality: N/A;  . LAPAROSCOPIC GASTRIC RESTRICTIVE DUODENAL PROCEDURE (DUODENAL SWITCH) N/A 02/06/2015   Procedure: LAPAROSCOPIC GASTRIC RESTRICTIVE DUODENAL PROCEDURE (DUODENAL SWITCH);  Surgeon: Geoffry Paradise, MD;  Location: [facility];  Service: General;  Laterality: N/A;  . LIPOMA EXCISION  2004  . VEIN SURGERY Bilateral    legs    Prior to Admission medications   Medication Sig Start Date End Date Taking? Authorizing Provider  atorvastatin (LIPITOR) 40 MG tablet Take 40 mg by mouth daily.    [provider]  docusate sodium (COLACE) 100 MG capsule Take 1 tablet once or twice daily as needed for constipation while taking narcotic pain medicine 09/08/17   Loleta Rose, MD  donepezil (ARICEPT) 10 MG tablet Take 10 mg by mouth daily.    [provider]  FLUoxetine (PROZAC) 40 MG capsule Take 1 capsule (40 mg total) by mouth daily. 06/05/17   Enid Baas, MD  lisinopril (PRINIVIL,ZESTRIL) 10 MG tablet  Take 10 mg by mouth daily.    [provider]  oxyCODONE-acetaminophen (PERCOCET) 5-325 MG tablet Take 1-2 tablets by mouth every 6 (six) hours as needed for severe pain. 09/08/17   Loleta Rose, MD  potassium chloride (KLOR-CON) 20 MEQ packet Take 20 mEq by mouth daily. Patient not taking: Reported on 09/19/2017 06/04/17   Enid Baas, MD  potassium chloride SA (K-DUR,KLOR-CON) 20 MEQ tablet Take 20 mEq by mouth daily. 08/20/17   [provider]  spironolactone (ALDACTONE) 25 MG tablet Take 1 tablet (25 mg total) by mouth  daily. 06/05/17   Enid Baas, MD  tamsulosin (FLOMAX) 0.4 MG CAPS Take 0.8 mg by mouth every morning.  08/04/12   [provider]    Allergies Patient has no known allergies.  Family History  Problem Relation Age of Onset  . Hypertension Other     Social History Social History   Tobacco Use  . Smoking status: Former Smoker    Packs/day: 1.00    Years: 6.00    Pack years: 6.00    Types: Cigarettes    Last attempt to quit: 01/26/1971    Years since quitting: 46.6  . Smokeless tobacco: Never Used  Substance Use Topics  . Alcohol use: No  . Drug use: No    Review of Systems  Constitutional: No fever/chills. Eyes: No visual changes. ENT: No sore throat. Cardiovascular: Denies chest pain. Respiratory: Denies shortness of breath. Gastrointestinal: No abdominal pain.  No nausea, no vomiting.  No diarrhea.  No constipation. Genitourinary: Negative for dysuria. Musculoskeletal: Positive for left shoulder pain.  Negative for back pain. Skin: Negative for rash. Neurological: Negative for headaches, focal weakness or numbness.   ____________________________________________   PHYSICAL EXAM:  VITAL SIGNS: ED Triage Vitals  Enc Vitals Group     BP 09/19/17 0232 (!) 171/92     Pulse Rate 09/19/17 0232 (!) 56     Resp 09/19/17 0232 (!) 22     Temp 09/19/17 0232 97.8 F (36.6 C)     Temp Source 09/19/17 0232 Oral     SpO2 09/19/17 0232 100 %     Weight 09/19/17 0233 170 lb (77.1 kg)     Height 09/19/17 0233 5\' 9"  (1.753 m)     Head Circumference --      Peak Flow --      Pain Score 09/19/17 0232 8     Pain Loc --      Pain Edu? --      Excl. in GC? --     Constitutional: Alert and oriented. Well appearing and in mild acute distress. Eyes: Conjunctivae are normal. PERRL. EOMI. Head: Atraumatic. Nose: No congestion/rhinnorhea. Mouth/Throat: Mucous membranes are moist.  Oropharynx non-erythematous. Neck: No stridor.  No cervical spine tenderness to  palpation. Cardiovascular: Normal rate, regular rhythm. Grossly normal heart sounds.  Good peripheral circulation. Respiratory: Normal respiratory effort.  No retractions. Lungs CTAB. Gastrointestinal: Soft and nontender. No distention. No abdominal bruits. No CVA tenderness. Musculoskeletal:  LUE: Scattered bruising noted to left upper extremity.  Shoulder and upper humerus tender to palpation with limited range of motion secondary to discomfort.  Moderate swelling noted to left hand and forearm.  2+ radial pulses.  Brisk, less than 5-second capillary refill.  Symmetrically warm limbs without evidence for ischemia.  No swelling to upper arm to suggest DVT. Neurologic:  Normal speech and language. No gross focal neurologic deficits are appreciated.  Skin:  Skin is warm, dry and intact. No  rash noted. Psychiatric: Mood and affect are normal. Speech and behavior are normal.  ____________________________________________   LABS (all labs ordered are listed, but only abnormal results are displayed)  Labs Reviewed  CBC WITH DIFFERENTIAL/PLATELET - Abnormal; Notable for the following components:      Result Value   RBC 3.89 (*)    Hemoglobin 12.4 (*)    HCT 36.6 (*)    All other components within normal limits  BASIC METABOLIC PANEL - Abnormal; Notable for the following components:   Potassium 2.4 (*)    Chloride 117 (*)    CO2 18 (*)    BUN 35 (*)    Calcium 8.3 (*)    GFR calc non Af Amer 59 (*)    All other components within normal limits  PROTIME-INR  APTT   ____________________________________________  EKG  None ____________________________________________  RADIOLOGY  ED MD interpretation: No acute fractures; brachial vein DVT  Official radiology report(s): US Venous Img Upper Uni Left  Result Date: 09/19/2017 CLINICAL DATA:  72 year old male with fracture of the left humeral head. Status post fall. EXAM: Left UPPER EXTREMITY VENOUS DOPPLER ULTRASOUND TECHNIQUE: Gray-scale  sonography with graded compression, as well as color Doppler and duplex ultrasound were performed to evaluate the upper extremity deep venous system from the level of the subclavian vein and including the jugular, axillary, basilic, radial, ulnar and upper cephalic vein. Spectral Doppler was utilized to evaluate flow at rest and with distal augmentation maneuvers. COMPARISON:  None. FINDINGS: Contralateral Subclavian Vein: Respiratory phasicity is normal and symmetric with the symptomatic side. No evidence of thrombus. Normal compressibility. Internal Jugular Vein: No evidence of thrombus. Normal compressibility, respiratory phasicity and response to augmentation. Subclavian Vein: No evidence of thrombus. Normal compressibility, respiratory phasicity and response to augmentation. Axillary Vein: No evidence of thrombus. Normal compressibility, respiratory phasicity and response to augmentation. Cephalic Vein: No evidence of thrombus. Normal compressibility, respiratory phasicity and response to augmentation. Basilic Vein: The basilic vein could not be evaluated as the patient could not tolerate this exam due to pain. Brachial Veins: There is occlusive thrombus in the brachial veins. Radial Veins: No evidence of thrombus. Normal compressibility, respiratory phasicity and response to augmentation. Ulnar Veins: No evidence of thrombus. Normal compressibility, respiratory phasicity and response to augmentation. Venous Reflux:  None visualized. Other Findings:  None visualized. IMPRESSION: Occlusive DVT involving the branches of the brachial vein. These results were called by telephone at the time of interpretation on 09/19/2017 at 5:53 am to Dr. Chiquita Loth , who verbally acknowledged these results. Electronically Signed   By: Elgie Collard M.D.   On: 09/19/2017 05:55   Dg Humerus Left  Result Date: 09/19/2017 CLINICAL DATA:  72 year old male with recent fall and humeral fracture. Repeated fall on the left shoulder.  EXAM: LEFT HUMERUS - 2+ VIEW COMPARISON:  Left shoulder radiograph dated 09/08/2017 FINDINGS: There is sclerotic changes of the margins of the previously seen left humeral neck fracture with partial healing. Old fracture of the lateral aspect of the left clavicle is also noted. No acute or new fracture identified. The bones are osteopenic. There is no dislocation. The soft tissues appear unremarkable. IMPRESSION: 1. No acute/new fractures. 2. Partially healed left humeral neck fracture as well as old clavicular fracture. Electronically Signed   By: Elgie Collard M.D.   On: 09/19/2017 03:02    ____________________________________________   PROCEDURES  Procedure(s) performed: None  Procedures  Critical Care performed: No  ____________________________________________   INITIAL IMPRESSION / ASSESSMENT  AND PLAN / ED COURSE  As part of my medical decision making, I reviewed the following data within the electronic MEDICAL RECORD NUMBER Nursing notes reviewed and incorporated, Old chart reviewed, Radiograph reviewed and Notes from prior ED visits   72 year old male with recent left humerus fracture who presents status post mechanical fall onto same shoulder.  Differential diagnosis includes but is not limited to acute fracture, worsening fracture, dislocation, DVT, etc.  Will repeat x-ray, administer IV morphine for pain.  Given left forearm and hand swelling, will obtain ultrasound to evaluate for DVT.   Clinical Course as of Sep 20 651  Sat Sep 19, 2017  0610 Spoke with radiologist regarding ultrasound results.  Will initiate heparin bolus and drip.  Oral potassium ordered.  Discussed with hospitalist who will evaluate patient in the emergency department for admission.   [JS]    Clinical Course User Index [JS] Irean Hong, MD     ____________________________________________   FINAL CLINICAL IMPRESSION(S) / ED DIAGNOSES  Final diagnoses:  Hypokalemia  Acute deep vein thrombosis  (DVT) of brachial vein of left upper extremity (HCC)  Closed nondisplaced fracture of surgical neck of left humerus with routine healing, unspecified fracture morphology, subsequent encounter     ED Discharge Orders    None       Note:  This document was prepared using Dragon voice recognition software and may include unintentional dictation errors.    Irean Hong, MD 09/19/17 [phone]

## 2017-09-19 NOTE — Progress Notes (Signed)
PT Cancellation Note  Patient Details Name: Brett Lam MRN: [id number] DOB: 1945-11-19   Cancelled Treatment:    Reason Eval/Treat Not Completed: Medical issues which prohibited therapy(Consult received and chart reviewed.  Patient noted with occlusive DVT to L brachial veins; anticoagulation initiated at 0752 on 09/19/17.  Per policy, requires 5 hours anticoagulation prior to initiation of exertional activity.  Additionally, patient noted with critically low potassium levels (2.4).  Will hold evaluation/exertional activity at this time and re-attempt at later time/date as medically appropriate.)   Brinley Rosete H. Manson Passey, PT, DPT, NCS 09/19/17, 11:16 AM [phone]

## 2017-09-19 NOTE — ED Notes (Signed)
Report to floor. Prepared for admission.

## 2017-09-19 NOTE — Progress Notes (Signed)
Patient admitted by Dr. Sheryle Hail this morning.  History and physical reviewed.  Stopped by to see the patient and his wife this morning. -Admitted with hyperkalemia, also came in after a fall and recent left humeral fracture.  Now with swelling and noted to have a DVT. -Receiving potassium supplements.  Also on IV heparin drip for now -Appreciate orthopedic consult -Check labs in a.m., likely oral anticoagulation tomorrow. -Physical therapy consult

## 2017-09-19 NOTE — ED Notes (Signed)
Pt states he cannot take pills, order received from md for liquid kcl.

## 2017-09-19 NOTE — H&P (Addendum)
Brett Lam is an 72 y.o. male.   Chief Complaint: Fall HPI: The patient with past medical history of falls with recent humerus fracture, dizziness, hypertension and hyperlipidemia presents to the emergency department complaining of another fall.  The patient landed on his outstretched already broken arm.  He was seen in the emergency department where he was found to have increased swelling of the left upper extremity.  Laboratory evaluation also revealed hypokalemia.  Potassium was repleted orally in the emergency department staff, hospitalist service for further evaluation of occlusive DVT.  Past Medical History:  Diagnosis Date  . Anxiety   . Diabetes mellitus without complication ([place])    off meds since wt loss  . Dizzy spells   . Elephantiasis   . Elevated PSA   . Enlarged prostate   . Genital herpes   . GERD (gastroesophageal reflux disease)    better since wt loss  . H/O flexible sigmoidoscopy 2002   rectal bleeding   . Hyperlipidemia   . Hypertension   . Osteoarthritis    better since wt loss  . Patient is Jehovah's Witness   . Rectal bleed   . Sleep apnea    cpap - does not need since wt loss  . Urinary frequency   . Venous stasis   . Wears dentures    partial upper and lower    Past Surgical History:  Procedure Laterality Date  . COLONOSCOPY    . COLONOSCOPY WITH PROPOFOL N/A 07/23/2015   Procedure: COLONOSCOPY WITH PROPOFOL;  Surgeon: Lucilla Lame, MD;  Location: [place];  Service: Endoscopy;  Laterality: N/A;  . LAPAROSCOPIC GASTRIC RESTRICTIVE DUODENAL PROCEDURE (DUODENAL SWITCH) N/A 02/06/2015   Procedure: LAPAROSCOPIC GASTRIC RESTRICTIVE DUODENAL PROCEDURE (DUODENAL SWITCH);  Surgeon: Bonner Puna, MD;  Location: [facility];  Service: General;  Laterality: N/A;  . LIPOMA EXCISION  2004  . VEIN SURGERY Bilateral    legs    Family History  Problem Relation Age of Onset  . Hypertension Other    Social History:  reports that he quit smoking about 46  years ago. His smoking use included cigarettes. He has a 6.00 pack-year smoking history. He has never used smokeless tobacco. He reports that he does not drink alcohol or use drugs.  Allergies: No Known Allergies  Prior to Admission medications   Medication Sig Start Date End Date Taking? Authorizing Provider  atorvastatin (LIPITOR) 40 MG tablet Take 40 mg by mouth daily.    [provider]  docusate sodium (COLACE) 100 MG capsule Take 1 tablet once or twice daily as needed for constipation while taking narcotic pain medicine 09/08/17   Hinda Kehr, MD  donepezil (ARICEPT) 10 MG tablet Take 10 mg by mouth daily.    [provider]  FLUoxetine (PROZAC) 40 MG capsule Take 1 capsule (40 mg total) by mouth daily. 06/05/17   Gladstone Lighter, MD  lisinopril (PRINIVIL,ZESTRIL) 10 MG tablet Take 10 mg by mouth daily.    [provider]  oxyCODONE-acetaminophen (PERCOCET) 5-325 MG tablet Take 1-2 tablets by mouth every 6 (six) hours as needed for severe pain. 09/08/17   Hinda Kehr, MD  potassium chloride (KLOR-CON) 20 MEQ packet Take 20 mEq by mouth daily. Patient not taking: Reported on 09/19/2017 06/04/17   Gladstone Lighter, MD  potassium chloride SA (K-DUR,KLOR-CON) 20 MEQ tablet Take 20 mEq by mouth daily. 08/20/17   [provider]  spironolactone (ALDACTONE) 25 MG tablet Take 1 tablet (25 mg total) by mouth  daily. 06/05/17   Gladstone Lighter, MD  tamsulosin (FLOMAX) 0.4 MG CAPS Take 0.8 mg by mouth every morning.  08/04/12   [provider]     Results for orders placed or performed during the hospital encounter of 09/19/17 (from the past 48 hour(s))  CBC with Differential     Status: Abnormal   Collection Time: 09/19/17  2:40 AM  Result Value Ref Range   WBC 7.6 3.8 - 10.6 K/uL   RBC 3.89 (L) 4.40 - 5.90 MIL/uL   Hemoglobin 12.4 (L) 13.0 - 18.0 g/dL   HCT 36.6 (L) 40.0 - 52.0 %   MCV 94.0 80.0 - 100.0 fL   MCH 31.8 26.0 - 34.0 pg   MCHC 33.8  32.0 - 36.0 g/dL   RDW 14.1 11.5 - 14.5 %   Platelets 245 150 - 440 K/uL   Neutrophils Relative % 52 %   Neutro Abs 3.9 1.4 - 6.5 K/uL   Lymphocytes Relative 38 %   Lymphs Abs 2.9 1.0 - 3.6 K/uL   Monocytes Relative 9 %   Monocytes Absolute 0.7 0.2 - 1.0 K/uL   Eosinophils Relative 1 %   Eosinophils Absolute 0.1 0 - 0.7 K/uL   Basophils Relative 0 %   Basophils Absolute 0.0 0 - 0.1 K/uL    Comment: Performed at [facility], [address] [place], [place] [address]  Basic metabolic panel     Status: Abnormal   Collection Time: 09/19/17  2:40 AM  Result Value Ref Range   Sodium 141 135 - 145 mmol/L   Potassium 2.4 (LL) 3.5 - 5.1 mmol/L    Comment: CRITICAL RESULT CALLED TO, READ BACK BY AND VERIFIED WITH APRIL BRUMGARD AT 0109 09/19/17.PMH   Chloride 117 (H) 101 - 111 mmol/L   CO2 18 (L) 22 - 32 mmol/L   Glucose, Bld 75 65 - 99 mg/dL   BUN 35 (H) 6 - 20 mg/dL   Creatinine, Ser 1.19 0.61 - 1.24 mg/dL   Calcium 8.3 (L) 8.9 - 10.3 mg/dL   GFR calc non Af Amer 59 (L) >60 mL/min   GFR calc Af Amer >60 >60 mL/min    Comment: (NOTE) The eGFR has been calculated using the CKD EPI equation. This calculation has not been validated in all clinical situations. eGFR's persistently <60 mL/min signify possible Chronic Kidney Disease.    Anion gap 6 5 - 15    Comment: Performed at [facility], [address] [place], [place] [address]  Protime-INR     Status: None   Collection Time: 09/19/17  2:40 AM  Result Value Ref Range   Prothrombin Time 13.7 11.4 - 15.2 seconds   INR 1.06     Comment: Performed at [facility], [address] [place], [place] [address]   US Venous Img Upper Uni Left  Result Date: 09/19/2017 CLINICAL DATA:  72 year old male with fracture of the left humeral head. Status post fall. EXAM: Left UPPER EXTREMITY VENOUS DOPPLER ULTRASOUND TECHNIQUE: Gray-scale sonography with graded compression, as well as color Doppler and duplex ultrasound  were performed to evaluate the upper extremity deep venous system from the level of the subclavian vein and including the jugular, axillary, basilic, radial, ulnar and upper cephalic vein. Spectral Doppler was utilized to evaluate flow at rest and with distal augmentation maneuvers. COMPARISON:  None. FINDINGS: Contralateral Subclavian Vein: Respiratory phasicity is normal and symmetric with the symptomatic side. No evidence of thrombus. Normal compressibility. Internal Jugular Vein:  No evidence of thrombus. Normal compressibility, respiratory phasicity and response to augmentation. Subclavian Vein: No evidence of thrombus. Normal compressibility, respiratory phasicity and response to augmentation. Axillary Vein: No evidence of thrombus. Normal compressibility, respiratory phasicity and response to augmentation. Cephalic Vein: No evidence of thrombus. Normal compressibility, respiratory phasicity and response to augmentation. Basilic Vein: The basilic vein could not be evaluated as the patient could not tolerate this exam due to pain. Brachial Veins: There is occlusive thrombus in the brachial veins. Radial Veins: No evidence of thrombus. Normal compressibility, respiratory phasicity and response to augmentation. Ulnar Veins: No evidence of thrombus. Normal compressibility, respiratory phasicity and response to augmentation. Venous Reflux:  None visualized. Other Findings:  None visualized. IMPRESSION: Occlusive DVT involving the branches of the brachial vein. These results were called by telephone at the time of interpretation on 09/19/2017 at 5:53 am to Dr. Lurline Hare , who verbally acknowledged these results. Electronically Signed   By: Anner Crete M.D.   On: 09/19/2017 05:55   Dg Humerus Left  Result Date: 09/19/2017 CLINICAL DATA:  72 year old male with recent fall and humeral fracture. Repeated fall on the left shoulder. EXAM: LEFT HUMERUS - 2+ VIEW COMPARISON:  Left shoulder radiograph dated 09/08/2017  FINDINGS: There is sclerotic changes of the margins of the previously seen left humeral neck fracture with partial healing. Old fracture of the lateral aspect of the left clavicle is also noted. No acute or new fracture identified. The bones are osteopenic. There is no dislocation. The soft tissues appear unremarkable. IMPRESSION: 1. No acute/new fractures. 2. Partially healed left humeral neck fracture as well as old clavicular fracture. Electronically Signed   By: Anner Crete M.D.   On: 09/19/2017 03:02    Review of Systems  Constitutional: Negative for chills and fever.  HENT: Negative for sore throat and tinnitus.   Eyes: Negative for blurred vision and redness.  Respiratory: Negative for cough and shortness of breath.   Cardiovascular: Negative for chest pain, palpitations, orthopnea and PND.  Gastrointestinal: Negative for abdominal pain, diarrhea, nausea and vomiting.  Genitourinary: Negative for dysuria, frequency and urgency.  Musculoskeletal: Positive for falls. Negative for joint pain and myalgias.       Left arm swelling  Skin: Negative for rash.       No lesions  Neurological: Negative for speech change, focal weakness and weakness.  Endo/Heme/Allergies: Does not bruise/bleed easily.       No temperature intolerance  Psychiatric/Behavioral: Negative for depression and suicidal ideas.    Blood pressure (!) 172/89, pulse (!) 49, temperature 97.8 F (36.6 C), temperature source Oral, resp. rate 18, height _0  (1.753 m), weight 77.1 kg (170 lb), SpO2 100 %. Physical Exam  Vitals reviewed. Constitutional: He is oriented to person, place, and time. He appears well-developed and well-nourished. No distress.  HENT:  Head: Normocephalic and atraumatic.  Mouth/Throat: Oropharynx is clear and moist.  Eyes: Pupils are equal, round, and reactive to light. Conjunctivae and EOM are normal. No scleral icterus.  Neck: Normal range of motion. Neck supple. No JVD present. No tracheal  deviation present. No thyromegaly present.  Cardiovascular: Normal rate, regular rhythm and normal heart sounds. Exam reveals no gallop and no friction rub.  No murmur heard. Respiratory: Effort normal and breath sounds normal. No respiratory distress. He has no wheezes.  Left chest wall bruising  GI: Soft. Bowel sounds are normal. He exhibits no distension. There is no tenderness.  Genitourinary:  Genitourinary Comments: Deferred  Musculoskeletal: Normal  range of motion. He exhibits edema (worse than usual lower extremities bilaterally) and deformity (left arm swelling).  Lymphadenopathy:    He has no cervical adenopathy.  Neurological: He is alert and oriented to person, place, and time. No cranial nerve deficit.  Skin: Skin is warm and dry. No rash noted. No erythema.  Psychiatric: He has a normal mood and affect. His behavior is normal. Judgment and thought content normal. Cognition and memory are impaired.     Assessment/Plan This is a 72 year old man admitted for use of DVTs of the left upper extremity. 1.  DVT: Swelling and pain due to occlusive deep venous thrombosis of brachial veins in the left arm.  Manage pain.  Continue heparin drip.  Consult vascular surgery for possible thrombectomy. 2.  Hypokalemia: No arrhythmias; replete potassium 3.  Humerus fracture: Partially healed; patient has not followed up.  Consult orthopedic surgery 4.  Falls: Repeated; likely related to dementia.  PT/OT eval prior to discharge 5.  Hypertension: Controlled; continue lisinopril and spironolactone 6.  Dementia: Continue donepezil and Prozac 7.  Hyperlipidemia: Continue statin therapy DVT prophylaxis: Therapeutic anticoagulation 6.  GI prophylaxis: None The patient is a full code.  Time spent on admission orders and patient care approximately 45 minutes  Harrie Foreman, MD 09/19/2017, 7:39 AM

## 2017-09-19 NOTE — Consult Note (Signed)
[facility] Vascular Consult Note  MRN : [id number]  Brett Lam is a 72 y.o. ([birth year]) male who presents with chief complaint of  Chief Complaint  Patient presents with  . Fall   History of Present Illness:  The patient is a 72 year old male with a past medical history of sleep apnea, hypertension, hyperlipidemia, GERD, enlarge prostate status post a fall approximately 11 days ago.  The patient was found to have a nondisplaced extra articular left proximal humerus fracture.  At this time, the patient was placed in a sling and was to follow-up with orthopedics as an outpatient.  Unfortunately, the patient fell again last night.  The mechanism of the fall was on his outstretched left arm.  The patient states he immediately started experiencing increased pain and swelling which is what prompted him to seek medical attention.  An ultrasound completed on the fourth 2019 was notable for occlusive DVT involving the branches of the brachial vein.  This is the first DVT the patient has ever been diagnosed with.  The patient was admitted and started on a heparin drip.  The patient denies any worsening left upper extremity pain.  The patient does note limited movement of the arm.  The patient denies any shortness of breath or chest pain.  The patient denies any fever, nausea or vomiting.  The vascular surgery service was consulted by Dr. Sheryle Hail for further recommendations.  Current Facility-Administered Medications  Medication Dose Route Frequency Provider Last Rate Last Dose  . 0.9 % NaCl with KCl 20 mEq/ L  infusion   Intravenous Continuous Arnaldo Natal, MD 100 mL/hr at 09/19/17 1046    . acetaminophen (TYLENOL) tablet 650 mg  650 mg Oral Q6H PRN Arnaldo Natal, MD       Or  . acetaminophen (TYLENOL) suppository 650 mg  650 mg Rectal Q6H PRN Arnaldo Natal, MD      . amLODipine (NORVASC) tablet 5 mg  5 mg Oral Daily Enid Baas, MD      .  atorvastatin (LIPITOR) tablet 40 mg  40 mg Oral Daily Arnaldo Natal, MD   40 mg at 09/19/17 0959  . docusate sodium (COLACE) capsule 100 mg  100 mg Oral BID Arnaldo Natal, MD   100 mg at 09/19/17 0959  . donepezil (ARICEPT) tablet 10 mg  10 mg Oral Daily Arnaldo Natal, MD   10 mg at 09/19/17 0959  . FLUoxetine (PROZAC) capsule 40 mg  40 mg Oral Daily Arnaldo Natal, MD   40 mg at 09/19/17 1000  . heparin ADULT infusion 100 units/mL (25000 units/29mL sodium chloride 0.45%)  1,200 Units/hr Intravenous Continuous Arnaldo Natal, MD 12 mL/hr at 09/19/17 0753 1,200 Units/hr at 09/19/17 0753  . lisinopril (PRINIVIL,ZESTRIL) tablet 10 mg  10 mg Oral Daily Arnaldo Natal, MD   10 mg at 09/19/17 0959  . ondansetron (ZOFRAN) tablet 4 mg  4 mg Oral Q6H PRN Arnaldo Natal, MD       Or  . ondansetron [facility]) injection 4 mg  4 mg Intravenous Q6H PRN Arnaldo Natal, MD      . oxyCODONE-acetaminophen (PERCOCET/ROXICET) 5-325 MG per tablet 1-2 tablet  1-2 tablet Oral Q6H PRN Arnaldo Natal, MD   2 tablet at 09/19/17 1046  . potassium chloride SA (K-DUR,KLOR-CON) CR tablet 20 mEq  20 mEq Oral Daily Arnaldo Natal, MD   20 mEq at 09/19/17 0959  . spironolactone (ALDACTONE)  tablet 25 mg  25 mg Oral Daily Arnaldo Natal, MD   25 mg at 09/19/17 0959  . tamsulosin (FLOMAX) capsule 0.8 mg  0.8 mg Oral Clarita Leber, MD       Past Medical History:  Diagnosis Date  . Anxiety   . Dizzy spells   . Elephantiasis   . Elevated PSA   . Enlarged prostate   . Genital herpes   . GERD (gastroesophageal reflux disease)    better since wt loss  . H/O flexible sigmoidoscopy 2002   rectal bleeding   . Hyperlipidemia   . Hypertension   . Osteoarthritis    better since wt loss  . Patient is Jehovah's Witness   . Rectal bleed   . Sleep apnea    cpap - does not need since wt loss  . Urinary frequency   . Venous stasis   . Wears dentures    partial upper and lower    Past Surgical History:  Procedure Laterality Date  . COLONOSCOPY    . COLONOSCOPY WITH PROPOFOL N/A 07/23/2015   Procedure: COLONOSCOPY WITH PROPOFOL;  Surgeon: Midge Minium, MD;  Location: [facility];  Service: Endoscopy;  Laterality: N/A;  . LAPAROSCOPIC GASTRIC RESTRICTIVE DUODENAL PROCEDURE (DUODENAL SWITCH) N/A 02/06/2015   Procedure: LAPAROSCOPIC GASTRIC RESTRICTIVE DUODENAL PROCEDURE (DUODENAL SWITCH);  Surgeon: Geoffry Paradise, MD;  Location: [facility];  Service: General;  Laterality: N/A;  . LIPOMA EXCISION  2004  . VEIN SURGERY Bilateral    legs   Social History Social History   Tobacco Use  . Smoking status: Former Smoker    Packs/day: 1.00    Years: 6.00    Pack years: 6.00    Types: Cigarettes    Last attempt to quit: 01/26/1971    Years since quitting: 46.6  . Smokeless tobacco: Never Used  Substance Use Topics  . Alcohol use: No  . Drug use: No   Family History Family History  Problem Relation Age of Onset  . Hypertension Other   The patient denies any family history of bleeding/clotting disorders, peripheral artery disease or venous disease.  No Known Allergies  REVIEW OF SYSTEMS (Negative unless checked)  Constitutional: [] Weight loss  [] Fever  [] Chills Cardiac: [] Chest pain   [] Chest pressure   [] Palpitations   [] Shortness of breath when laying flat   [] Shortness of breath at rest   [] Shortness of breath with exertion. Vascular:  [] Pain in legs with walking   [] Pain in legs at rest   [] Pain in legs when laying flat   [] Claudication   [] Pain in feet when walking  [] Pain in feet at rest  [] Pain in feet when laying flat   [x] History of DVT   [] Phlebitis   [] Swelling in legs   [] Varicose veins   [] Non-healing ulcers Pulmonary:   [] Uses home oxygen   [] Productive cough   [] Hemoptysis   [] Wheeze  [] COPD   [] Asthma Neurologic:  [] Dizziness  [] Blackouts   [] Seizures   [] History of stroke   [] History of TIA  [] Aphasia   [] Temporary blindness   [] Dysphagia    [] Weakness or numbness in arms   [] Weakness or numbness in legs Musculoskeletal:  [] Arthritis   [] Joint swelling   [] Joint pain   [] Low back pain Hematologic:  [] Easy bruising  [] Easy bleeding   [] Hypercoagulable state   [] Anemic  [] Hepatitis Gastrointestinal:  [] Blood in stool   [] Vomiting blood  [x] Gastroesophageal reflux/heartburn   [] Difficulty swallowing. Genitourinary:  [] Chronic kidney disease   []   Difficult urination  [] Frequent urination  [] Burning with urination   [] Blood in urine Skin:  [] Rashes   [] Ulcers   [] Wounds Psychological:  [x] History of anxiety   []  History of major depression.  Physical Examination  Vitals:   09/19/17 0615 09/19/17 0645 09/19/17 0701 09/19/17 0900  BP:   (!) 172/89 (!) 180/83  Pulse:   (!) 49 (!) 50  Resp: 15 (!) 23 18 17   Temp:    98 F (36.7 C)  TempSrc:    Oral  SpO2:   100% 100%  Weight:      Height:       Body mass index is 25.1 kg/m. Gen:  WD/WN, NAD Head: /AT, No temporalis wasting. Prominent temp pulse not noted. Ear/Nose/Throat: Hearing grossly intact, nares w/o erythema or drainage, oropharynx w/o Erythema/Exudate Eyes: Sclera non-icteric, conjunctiva clear Neck: Trachea midline.  No JVD.  Pulmonary:  Good air movement, respirations not labored, equal bilaterally.  Cardiac: RRR, normal S1, S2. Vascular:  Vessel Right Left  Radial Palpable Palpable  Ulnar Palpable Palpable  Brachial Palpable Palpable  Carotid Palpable, without bruit Palpable, without bruit  Aorta Not palpable N/A  Femoral Palpable Palpable  Popliteal Palpable Palpable  PT Palpable Palpable  DP Palpable Palpable   Left upper extremity: There is some ecchymosis noted just proximal to the antecubital fossa and to the back of the left upper arm.  The arm is moderately edematous.  There is no pain with palpation.  Palpable radial and ulnar pulses.  Good capillary refill.  There is no acute vascular compromise to the left upper extremity  Gastrointestinal: soft,  non-tender/non-distended. No guarding/reflex.  Musculoskeletal: M/S 5/5 throughout.  Extremities without ischemic changes.  No deformity or atrophy. No edema. Neurologic: Sensation grossly intact in extremities.  Symmetrical.  Speech is fluent. Motor exam as listed above. Psychiatric: Judgment intact, Mood & affect appropriate for pt's clinical situation. Dermatologic: No rashes or ulcers noted.  No cellulitis or open wounds. Lymph : No Cervical, Axillary, or Inguinal lymphadenopathy.  CBC Lab Results  Component Value Date   WBC 7.6 09/19/2017   HGB 12.4 (L) 09/19/2017   HCT 36.6 (L) 09/19/2017   MCV 94.0 09/19/2017   PLT 245 09/19/2017   BMET    Component Value Date/Time   NA 141 09/19/2017 0240   K 3.3 (L) 09/19/2017 1434   K 3.4 02/06/2015 1245   CL 117 (H) 09/19/2017 0240   CO2 18 (L) 09/19/2017 0240   GLUCOSE 75 09/19/2017 0240   BUN 35 (H) 09/19/2017 0240   CREATININE 1.19 09/19/2017 0240   CALCIUM 8.3 (L) 09/19/2017 0240   GFRNONAA 59 (L) 09/19/2017 0240   GFRAA >60 09/19/2017 0240   Estimated Creatinine Clearance: 56.1 mL/min (by C-G formula based on SCr of 1.19 mg/dL).  COAG Lab Results  Component Value Date   INR 1.06 09/19/2017   Radiology US Venous Img Upper Uni Left  Result Date: 09/19/2017 CLINICAL DATA:  72 year old male with fracture of the left humeral head. Status post fall. EXAM: Left UPPER EXTREMITY VENOUS DOPPLER ULTRASOUND TECHNIQUE: Gray-scale sonography with graded compression, as well as color Doppler and duplex ultrasound were performed to evaluate the upper extremity deep venous system from the level of the subclavian vein and including the jugular, axillary, basilic, radial, ulnar and upper cephalic vein. Spectral Doppler was utilized to evaluate flow at rest and with distal augmentation maneuvers. COMPARISON:  None. FINDINGS: Contralateral Subclavian Vein: Respiratory phasicity is normal and symmetric with the symptomatic side.  No evidence of  thrombus. Normal compressibility. Internal Jugular Vein: No evidence of thrombus. Normal compressibility, respiratory phasicity and response to augmentation. Subclavian Vein: No evidence of thrombus. Normal compressibility, respiratory phasicity and response to augmentation. Axillary Vein: No evidence of thrombus. Normal compressibility, respiratory phasicity and response to augmentation. Cephalic Vein: No evidence of thrombus. Normal compressibility, respiratory phasicity and response to augmentation. Basilic Vein: The basilic vein could not be evaluated as the patient could not tolerate this exam due to pain. Brachial Veins: There is occlusive thrombus in the brachial veins. Radial Veins: No evidence of thrombus. Normal compressibility, respiratory phasicity and response to augmentation. Ulnar Veins: No evidence of thrombus. Normal compressibility, respiratory phasicity and response to augmentation. Venous Reflux:  None visualized. Other Findings:  None visualized. IMPRESSION: Occlusive DVT involving the branches of the brachial vein. These results were called by telephone at the time of interpretation on 09/19/2017 at 5:53 am to Dr. Chiquita Loth , who verbally acknowledged these results. Electronically Signed   By: Elgie Collard M.D.   On: 09/19/2017 05:55   Dg Shoulder Left  Result Date: 09/08/2017 CLINICAL DATA:  Status post fall, with left shoulder pain. Initial encounter. EXAM: LEFT SHOULDER - 2+ VIEW COMPARISON:  None. FINDINGS: There is a mildly displaced fracture through the surgical neck of the left humerus. Given only mild displacement, this is compatible with a Neer 1 part fracture. The left humeral head is seated within the glenoid fossa. Deformity of the distal left clavicle is thought to be chronic in nature, though new from 2016. No significant soft tissue abnormalities are seen. The visualized portions of the left lung are clear. IMPRESSION: 1. Mildly displaced fracture through the surgical neck  of the left humerus. 2. Deformity of the distal left clavicle is thought to be chronic in nature, though new from 2016. Electronically Signed   By: Roanna Raider M.D.   On: 09/08/2017 02:52   Dg Humerus Left  Result Date: 09/19/2017 CLINICAL DATA:  72 year old male with recent fall and humeral fracture. Repeated fall on the left shoulder. EXAM: LEFT HUMERUS - 2+ VIEW COMPARISON:  Left shoulder radiograph dated 09/08/2017 FINDINGS: There is sclerotic changes of the margins of the previously seen left humeral neck fracture with partial healing. Old fracture of the lateral aspect of the left clavicle is also noted. No acute or new fracture identified. The bones are osteopenic. There is no dislocation. The soft tissues appear unremarkable. IMPRESSION: 1. No acute/new fractures. 2. Partially healed left humeral neck fracture as well as old clavicular fracture. Electronically Signed   By: Elgie Collard M.D.   On: 09/19/2017 03:02   Assessment/Plan The patient is a 72 year old male s/p two recent falls nondisplaced extra articular left proximal humerus fracture now with DVT of the occlusive DVT involving the branches of the brachial vein - stable 1. Occlusive DVT involving the branches of the brachial vein: Due to the location of the patient's DVT (being one of the smaller veins branches) and lack of significant symptoms the patient is not a candidate for venous lysis.  Agree with current anticoagulation with heparin drip.  Would transition to Eliquis 5 mg 1 tab twice daily. The patient should elevate the extremity as much as possible.  The patient should follow-up in our office in 1 to 2 weeks to undergo a repeat duplex. 2.  Hyperlipidemia: On statin. Encouraged good control as its slows the progression of atherosclerotic disease 3.  Hypertension: Encouraged good control as its slows the progression  of atherosclerotic disease  Discussed with Dr. Weldon Inches, PA-C  09/19/2017 3:36 PM  This  note was created with Dragon medical transcription system.  Any error is purely unintentional.

## 2017-09-19 NOTE — Progress Notes (Addendum)
ANTICOAGULATION CONSULT NOTE - Initial Consult  Pharmacy Consult for heparin drip Indication: VTE/prophylaxis  No Known Allergies  Patient Measurements: Height: 5\' 9"  (175.3 cm) Weight: 170 lb (77.1 kg) IBW/kg (Calculated) : 70.7 Heparin Dosing Weight: 77 kg  Vital Signs: Temp: 97.8 F (36.6 C) (05/04 0232) Temp Source: Oral (05/04 0232) BP: 152/86 (05/04 0430) Pulse Rate: 47 (05/04 0315)  Labs: Recent Labs    09/19/17 0240  LABPROT 13.7  INR 1.06  CREATININE 1.19    Estimated Creatinine Clearance: 56.1 mL/min (by C-G formula based on SCr of 1.19 mg/dL).   Medical History: Past Medical History:  Diagnosis Date  . Anxiety   . Diabetes mellitus without complication (HCC)    off meds since wt loss  . Dizzy spells   . Elephantiasis   . Elevated PSA   . Enlarged prostate   . Genital herpes   . GERD (gastroesophageal reflux disease)    better since wt loss  . H/O flexible sigmoidoscopy 2002   rectal bleeding   . Hyperlipidemia   . Hypertension   . Osteoarthritis    better since wt loss  . Patient is Jehovah's Witness   . Rectal bleed   . Sleep apnea    cpap - does not need since wt loss  . Urinary frequency   . Venous stasis   . Wears dentures    partial upper and lower    Medications:  No anticoagulation in PTA meds.  Assessment:  Goal of Therapy:  Heparin level 0.3-0.7 units/ml Monitor platelets by anticoagulation protocol: Yes   Plan:  4600 unit bolus and initial rate of 1200 units/hr. First heparin level 8 hours after start of infusion.  Exilda Wilhite S 09/19/2017,6:06 AM

## 2017-09-19 NOTE — ED Notes (Signed)
Returned from ultrasound.

## 2017-09-19 NOTE — Progress Notes (Signed)
OT Cancellation Note  Patient Details Name: Brett Lam MRN: [id number] DOB: May 02, 1946   Cancelled Treatment:    Reason Eval/Treat Not Completed: Medical issues which prohibited therapy  Consult received and chart reviewed.  Patient with occlusive DVT to L brachial veins; anticoagulation initiated at 0752 on 09/19/17.   Per policy, requires 5 hours anticoagulation prior to initiation of exertional activity.  Pt also with critically low potassium levels (2.4).  Will hold  OT evaluation at this time and re-attempt at later time/date as medically appropriate.)     Azarie Coriz OTR/L,CLT 09/19/2017, 11:54 AM

## 2017-09-19 NOTE — ED Notes (Signed)
Monitor SB rate 48. L arm edematous and ecchymotic. CRT nailbeds 2 sec with good sensation.

## 2017-09-19 NOTE — ED Notes (Signed)
Critical potassium of 2.4 called from lab. Dr. Dolores Frame notified.

## 2017-09-19 NOTE — ED Triage Notes (Signed)
Pt with recent left humeral head fracture. Pt states he overreached for something tonight,lost his balance and fell on left shoulder injuring again. Left hand is swollen, cms intact to left fingers, 2+ radial pulse noted left.

## 2017-09-19 NOTE — ED Notes (Signed)
Report to kim, rn. Continue to await heparin infusion.

## 2017-09-19 NOTE — ED Notes (Signed)
Patient transported to Ultrasound 

## 2017-09-19 NOTE — ED Notes (Signed)
Pt provided with warm blankets. Pt and spouse updated on care plan.

## 2017-09-19 NOTE — ED Notes (Signed)
Pt and spouse updated on care plan. Spouse verbalizes understanding.

## 2017-09-19 NOTE — Consult Note (Signed)
ORTHOPAEDIC CONSULTATION  REQUESTING PHYSICIAN: Enid Baas, MD  Chief Complaint:   Left shoulder pain and swelling.  History of Present Illness: Brett Lam is a 72 y.o. male with multiple medical problems including hypertension, hyperlipidemia, gastroesophageal reflux disease, anxiety, an enlarged prostate, and sleep apnea who lives independently with his wife.  Apparently, the patient fell and injured his left shoulder 11 days ago.  X-rays in the emergency room demonstrated an essentially nondisplaced extra articular proximal humerus fracture.  He was placed into a sling and, after his case was discussed with Dr. Hyacinth Meeker, the orthopedic surgeon on-call that night, follow-up was arranged with [facility].  Apparently, the patient fell again last evening, landing on his outstretched left arm.  He complained of increased pain in the arm, and increased swelling was noted in the arm.  Ultrasound evaluation demonstrated an acute DVT involving the branches of the brachial vein.  He also was noted to be hypokalemic.  The patient was admitted for pain control, anticoagulation, and possible rehab placement.  X-rays of the left shoulder showed no further displacement of the proximal humerus fracture.  Apparently, the patient has been wearing a sling on a regular basis since the initial injury.  He denies any numbness or paresthesias to his left hand.  Past Medical History:  Diagnosis Date  . Anxiety   . Dizzy spells   . Elephantiasis   . Elevated PSA   . Enlarged prostate   . Genital herpes   . GERD (gastroesophageal reflux disease)    better since wt loss  . H/O flexible sigmoidoscopy 2002   rectal bleeding   . Hyperlipidemia   . Hypertension   . Osteoarthritis    better since wt loss  . Patient is Jehovah's Witness   . Rectal bleed   . Sleep apnea    cpap - does not need since wt loss  . Urinary frequency   .  Venous stasis   . Wears dentures    partial upper and lower   Past Surgical History:  Procedure Laterality Date  . COLONOSCOPY    . COLONOSCOPY WITH PROPOFOL N/A 07/23/2015   Procedure: COLONOSCOPY WITH PROPOFOL;  Surgeon: Midge Minium, MD;  Location: [facility];  Service: Endoscopy;  Laterality: N/A;  . LAPAROSCOPIC GASTRIC RESTRICTIVE DUODENAL PROCEDURE (DUODENAL SWITCH) N/A 02/06/2015   Procedure: LAPAROSCOPIC GASTRIC RESTRICTIVE DUODENAL PROCEDURE (DUODENAL SWITCH);  Surgeon: Geoffry Paradise, MD;  Location: [facility];  Service: General;  Laterality: N/A;  . LIPOMA EXCISION  2004  . VEIN SURGERY Bilateral    legs   Social History   Socioeconomic History  . Marital status: Married    Spouse name: Not on file  . Number of children: Not on file  . Years of education: Not on file  . Highest education level: Not on file  Occupational History  . Not on file  Social Needs  . Financial resource strain: Not on file  . Food insecurity:    Worry: Not on file    Inability: Not on file  . Transportation needs:    Medical: Not on file    Non-medical: Not on file  Tobacco Use  . Smoking status: Former Smoker    Packs/day: 1.00    Years: 6.00    Pack years: 6.00    Types: Cigarettes    Last attempt to quit: 01/26/1971    Years since quitting: 46.6  . Smokeless tobacco: Never Used  Substance and Sexual Activity  . Alcohol use:  No  . Drug use: No  . Sexual activity: Not on file  Lifestyle  . Physical activity:    Days per week: Not on file    Minutes per session: Not on file  . Stress: Not on file  Relationships  . Social connections:    Talks on phone: Not on file    Gets together: Not on file    Attends religious service: Not on file    Active member of club or organization: Not on file    Attends meetings of clubs or organizations: Not on file    Relationship status: Not on file  Other Topics Concern  . Not on file  Social History Narrative  . Not on file   Family  History  Problem Relation Age of Onset  . Hypertension Other    No Known Allergies Prior to Admission medications   Medication Sig Start Date End Date Taking? Authorizing Provider  atorvastatin (LIPITOR) 40 MG tablet Take 40 mg by mouth daily.   Yes [provider]  donepezil (ARICEPT) 10 MG tablet Take 10 mg by mouth daily.   Yes [provider]  FLUoxetine (PROZAC) 40 MG capsule Take 1 capsule (40 mg total) by mouth daily. 06/05/17  Yes Enid Baas, MD  lisinopril (PRINIVIL,ZESTRIL) 10 MG tablet Take 10 mg by mouth daily.   Yes [provider]  oxyCODONE-acetaminophen (PERCOCET) 5-325 MG tablet Take 1-2 tablets by mouth every 6 (six) hours as needed for severe pain. 09/08/17  Yes Loleta Rose, MD  potassium chloride SA (K-DUR,KLOR-CON) 20 MEQ tablet Take 20 mEq by mouth daily. 08/20/17  Yes [provider]  spironolactone (ALDACTONE) 25 MG tablet Take 1 tablet (25 mg total) by mouth daily. 06/05/17  Yes Enid Baas, MD  tamsulosin (FLOMAX) 0.4 MG CAPS Take 0.8 mg by mouth every morning.  08/04/12  Yes [provider]  docusate sodium (COLACE) 100 MG capsule Take 1 tablet once or twice daily as needed for constipation while taking narcotic pain medicine Patient not taking: Reported on 09/19/2017 09/08/17   Loleta Rose, MD  potassium chloride (KLOR-CON) 20 MEQ packet Take 20 mEq by mouth daily. Patient not taking: Reported on 09/19/2017 06/04/17   Enid Baas, MD   US Venous Img Upper Uni Left  Result Date: 09/19/2017 CLINICAL DATA:  72 year old male with fracture of the left humeral head. Status post fall. EXAM: Left UPPER EXTREMITY VENOUS DOPPLER ULTRASOUND TECHNIQUE: Gray-scale sonography with graded compression, as well as color Doppler and duplex ultrasound were performed to evaluate the upper extremity deep venous system from the level of the subclavian vein and including the jugular, axillary, basilic, radial, ulnar and upper  cephalic vein. Spectral Doppler was utilized to evaluate flow at rest and with distal augmentation maneuvers. COMPARISON:  None. FINDINGS: Contralateral Subclavian Vein: Respiratory phasicity is normal and symmetric with the symptomatic side. No evidence of thrombus. Normal compressibility. Internal Jugular Vein: No evidence of thrombus. Normal compressibility, respiratory phasicity and response to augmentation. Subclavian Vein: No evidence of thrombus. Normal compressibility, respiratory phasicity and response to augmentation. Axillary Vein: No evidence of thrombus. Normal compressibility, respiratory phasicity and response to augmentation. Cephalic Vein: No evidence of thrombus. Normal compressibility, respiratory phasicity and response to augmentation. Basilic Vein: The basilic vein could not be evaluated as the patient could not tolerate this exam due to pain. Brachial Veins: There is occlusive thrombus in the brachial veins. Radial Veins: No evidence of thrombus. Normal compressibility, respiratory phasicity and response to augmentation. Ulnar  Veins: No evidence of thrombus. Normal compressibility, respiratory phasicity and response to augmentation. Venous Reflux:  None visualized. Other Findings:  None visualized. IMPRESSION: Occlusive DVT involving the branches of the brachial vein. These results were called by telephone at the time of interpretation on 09/19/2017 at 5:53 am to Dr. Chiquita Loth , who verbally acknowledged these results. Electronically Signed   By: Elgie Collard M.D.   On: 09/19/2017 05:55   Dg Humerus Left  Result Date: 09/19/2017 CLINICAL DATA:  72 year old male with recent fall and humeral fracture. Repeated fall on the left shoulder. EXAM: LEFT HUMERUS - 2+ VIEW COMPARISON:  Left shoulder radiograph dated 09/08/2017 FINDINGS: There is sclerotic changes of the margins of the previously seen left humeral neck fracture with partial healing. Old fracture of the lateral aspect of the left  clavicle is also noted. No acute or new fracture identified. The bones are osteopenic. There is no dislocation. The soft tissues appear unremarkable. IMPRESSION: 1. No acute/new fractures. 2. Partially healed left humeral neck fracture as well as old clavicular fracture. Electronically Signed   By: Elgie Collard M.D.   On: 09/19/2017 03:02    Positive ROS: All other systems have been reviewed and were otherwise negative with the exception of those mentioned in the HPI and as above.  Physical Exam: General:  Alert, no acute distress Psychiatric:  Patient is competent for consent with normal mood and affect   Cardiovascular:  No pedal edema Respiratory:  No wheezing, non-labored breathing GI:  Abdomen is soft and non-tender Skin:  No lesions in the area of chief complaint Neurologic:  Sensation intact distally Lymphatic:  No axillary or cervical lymphadenopathy  Orthopedic Exam:  Orthopedic examination is limited to the left shoulder and upper extremity.  Skin inspection around the shoulder demonstrates minimal swelling but otherwise unremarkable.  There is no ecchymosis, erythema, abrasions, lacerations, or other skin abnormalities identified.  He has mild tenderness to palpation over the anterolateral aspect of the shoulder.  He has moderate pain with attempted active passive motion of the shoulder.  He has moderate swelling in the forearm and into the hand.  He is able to actively flex and extend all digits, although motion is somewhat limited due to the swelling in his forearm and hand.  He is able to flex and extend his elbow, although this is weak, presumably due to the pain in his shoulder.  Sensation is intact to light touch in all distributions, including the axillary nerve and musculocutaneous nerve distributions.  He has good capillary refill to all digits.  X-rays:  X-rays of the left shoulder are available for review.  These films demonstrate an essentially nondisplaced  extra-articular 2 part proximal humerus fracture.  The humeral head is well aligned within the glenoid.  No significant degenerative changes are identified.  No lytic lesions are noted.  Assessment: Subacute closed nondisplaced 2 part left proximal humerus fracture with acute left upper extremity DVT.  Plan: The treatment options have been discussed with the patient and his wife, who is at the bedside.  This injury certainly can be treated nonsurgically with shoulder mobilization for 3 to 4 weeks followed by physical therapy to help restore range of motion and strength.  Apparently, the patient had initiated care with [facility] (Dr. Jonathon Bellows. Odis Luster), so he is welcome to follow-up with them in the next 2 to 3 weeks.  In addition, appropriate anti-coagulation therapy should be continued for his upper extremity DVT.  Thank you for asked me  to participate in the care of this most pleasant and unfortunate man.  If he is unable to arrange follow-up with [facility] following discharge, we would be happy to see him in our office in 2 to 3 weeks.   Maryagnes Amos, MD  Beeper #:  [phone]  09/19/2017 12:25 PM

## 2017-09-19 NOTE — Progress Notes (Signed)
ANTICOAGULATION CONSULT NOTE - Initial Consult  Pharmacy Consult for heparin drip Indication: VTE/prophylaxis  No Known Allergies  Patient Measurements: Height: 5\' 9"  (175.3 cm) Weight: 170 lb (77.1 kg) IBW/kg (Calculated) : 70.7 Heparin Dosing Weight: 77 kg  Vital Signs: Temp: 98 F (36.7 C) (05/04 0900) Temp Source: Oral (05/04 0900) BP: 134/75 (05/04 1542) Pulse Rate: 48 (05/04 1542)  Labs: Recent Labs    09/19/17 0240 09/19/17 0726 09/19/17 1434  HGB 12.4*  --   --   HCT 36.6*  --   --   PLT 245  --   --   APTT  --  33  --   LABPROT 13.7  --   --   INR 1.06  --   --   HEPARINUNFRC  --   --  0.24*  CREATININE 1.19  --   --     Estimated Creatinine Clearance: 56.1 mL/min (by C-G formula based on SCr of 1.19 mg/dL).   Medical History: Past Medical History:  Diagnosis Date  . Anxiety   . Dizzy spells   . Elephantiasis   . Elevated PSA   . Enlarged prostate   . Genital herpes   . GERD (gastroesophageal reflux disease)    better since wt loss  . H/O flexible sigmoidoscopy 2002   rectal bleeding   . Hyperlipidemia   . Hypertension   . Osteoarthritis    better since wt loss  . Patient is Jehovah's Witness   . Rectal bleed   . Sleep apnea    cpap - does not need since wt loss  . Urinary frequency   . Venous stasis   . Wears dentures    partial upper and lower    Medications:  No anticoagulation in PTA meds.  Assessment:  Goal of Therapy:  Heparin level 0.3-0.7 units/ml Monitor platelets by anticoagulation protocol: Yes   Plan:  4600 unit bolus and initial rate of 1200 units/hr. First heparin level 8 hours after start of infusion.  5/4 1511 HL 0.24- Level is subtherapeutic. Will increase infusion to Heparin 1350u/hr and recheck HL in 8 hours.    2003, PharmD, BCPS Clinical Pharmacist 09/19/2017 4:29 PM

## 2017-09-19 NOTE — ED Notes (Signed)
Assisted pt with urinal

## 2017-09-20 DIAGNOSIS — O2230 Deep phlebothrombosis in pregnancy, unspecified trimester: Secondary | ICD-10-CM | POA: Diagnosis present

## 2017-09-20 LAB — BASIC METABOLIC PANEL
Anion gap: 4 — ABNORMAL LOW (ref 5–15)
BUN: 30 mg/dL — ABNORMAL HIGH (ref 6–20)
CO2: 20 mmol/L — ABNORMAL LOW (ref 22–32)
Calcium: 8 mg/dL — ABNORMAL LOW (ref 8.9–10.3)
Chloride: 118 mmol/L — ABNORMAL HIGH (ref 101–111)
Creatinine, Ser: 1.04 mg/dL (ref 0.61–1.24)
GFR calc Af Amer: >60 mL/min (ref >60)
GFR calc non Af Amer: >60 mL/min (ref >60)
Glucose, Bld: 78 mg/dL (ref 65–99)
Potassium: 3.4 mmol/L — ABNORMAL LOW (ref 3.5–5.1)
Sodium: 142 mmol/L (ref 135–145)

## 2017-09-20 LAB — HEPARIN LEVEL (UNFRACTIONATED): Heparin Unfractionated: 0.24 IU/mL — ABNORMAL LOW (ref 0.30–0.70)

## 2017-09-20 LAB — CBC
HCT: 37.5 % — ABNORMAL LOW (ref 40.0–52.0)
Hemoglobin: 12.6 g/dL — ABNORMAL LOW (ref 13.0–18.0)
MCH: 31.9 pg (ref 26.0–34.0)
MCHC: 33.7 g/dL (ref 32.0–36.0)
MCV: 94.7 fL (ref 80.0–100.0)
Platelets: 275 10*3/uL (ref 150–440)
RBC: 3.96 MIL/uL — ABNORMAL LOW (ref 4.40–5.90)
RDW: 14.6 % — ABNORMAL HIGH (ref 11.5–14.5)
WBC: 7.9 10*3/uL (ref 3.8–10.6)

## 2017-09-20 MED ORDER — HEPARIN BOLUS VIA INFUSION
1200.0000 [IU] | Freq: Once | INTRAVENOUS | Status: AC
Start: 2017-09-20 — End: 2017-09-20
  Administered 2017-09-20: 1200 [IU] via INTRAVENOUS
  Filled 2017-09-20: qty 1200

## 2017-09-20 MED ORDER — AMLODIPINE BESYLATE 5 MG PO TABS: 5.0000 mg | ORAL_TABLET | Freq: Every day | ORAL | 2 refills | Status: DC

## 2017-09-20 MED ORDER — APIXABAN 5 MG PO TABS: 5.0000 mg | ORAL_TABLET | Freq: Two times a day (BID) | ORAL | Status: DC

## 2017-09-20 MED ORDER — APIXABAN 5 MG PO TABS
10.0000 mg | ORAL_TABLET | Freq: Two times a day (BID) | ORAL | Status: DC
  Administered 2017-09-20: 10 mg via ORAL
  Filled 2017-09-20: qty 2

## 2017-09-20 MED ORDER — ELIQUIS 5 MG VTE STARTER PACK: ORAL_TABLET | ORAL | 0 refills | Status: DC

## 2017-09-20 MED ORDER — POTASSIUM CHLORIDE CRYS ER 20 MEQ PO TBCR
40.0000 meq | EXTENDED_RELEASE_TABLET | Freq: Every day | ORAL | Status: DC
  Administered 2017-09-20: 40 meq via ORAL
  Filled 2017-09-20: qty 2

## 2017-09-20 MED ORDER — APIXABAN (ELIQUIS) EDUCATION KIT FOR DVT/PE PATIENTS: PACK | 0 refills | Status: DC

## 2017-09-20 NOTE — Care Management Obs Status (Signed)
MEDICARE OBSERVATION STATUS NOTIFICATION   Patient Details  Name: Brett Lam MRN: [id number] Date of Birth: 09-09-1945   Medicare Observation Status Notification Given:  Yes    Chapman Fitch, RN 09/20/2017, 1:19 PM

## 2017-09-20 NOTE — Care Management (Signed)
PT has assessed patient and recommend home health PT, OT, 3in1, and loftstrand crutches.  RNCM spoke with [place] from [facility].  [facility] Court states that provider is to see patient tomorrow, and they will arrange services and equipment.  H&P and PT note faxed to [phone].  Discharge summary not available at this time to fax

## 2017-09-20 NOTE — Evaluation (Signed)
Physical Therapy Evaluation Patient Details Name: Brett Lam MRN: [id number] DOB: 14-May-1946 Today's Date: 09/20/2017   History of Present Illness  presented to ER secondary to fall on outstretched UE (L), increased pain, swelling; admitted with L UE DVT, hypokalemia (current K+ 3.4).  On anticoagulation since 5/4; transitioned to PO  Clinical Impression  Upon evaluation, patient alert and oriented to basic information; poor short-term memory and recall of new information (history of dementia).  L UE immobilized in sling (donned, maintained during session); generally edematous and painful.  Limited awareness of precautions related to shoulder noted.  Able to complete bed mobility with close sup (to ensure NWB L UE, protect/position); sit/stand, basic transfers and gait (100') with loftstrand crutch, cga/close sup. Higher level balance deficits (decreased reaction time and balance strategies) noted.  Patient subjectively reporting optimal comfort/confidence with loftstrand; do recommend continued use with all mobility at discharge.  Patient/wife voiced understanding and agreement. Would benefit from skilled PT to address above deficits and promote optimal return to PLOF; Recommend transition to HHPT, HHOT upon discharge from acute hospitalization.     Follow Up Recommendations Home health PT(HHOT)    Equipment Recommendations  3in1 (PT)(loftstrand crutches)    Recommendations for Other Services       Precautions / Restrictions Precautions Precautions: Fall Required Braces or Orthoses: (L shoulder sling) Restrictions Weight Bearing Restrictions: Yes LUE Weight Bearing: Non weight bearing      Mobility  Bed Mobility Overal bed mobility: Modified Independent                Transfers Overall transfer level: Needs assistance   Transfers: Sit to/from Stand Sit to Stand: Min guard         General transfer comment: cuing for hand placement, assist device  use  Ambulation/Gait Ambulation/Gait assistance: Min guard Ambulation Distance (Feet): 100 Feet(x2) Assistive device: Lofstrands       General Gait Details: reciprocal stepping pattern with good position, control of loftstrand; subjectively reporting good comfort/confidence with use of assist device  Stairs            Wheelchair Mobility    Modified Rankin (Stroke Patients Only)       Balance Overall balance assessment: Needs assistance Sitting-balance support: No upper extremity supported;Feet supported Sitting balance-Leahy Scale: Good     Standing balance support: Single extremity supported Standing balance-Leahy Scale: Fair                               Pertinent Vitals/Pain Pain Assessment: Faces Faces Pain Scale: Hurts little more Pain Location: L UE Pain Descriptors / Indicators: Aching;Grimacing;Guarding Pain Intervention(s): Limited activity within patient's tolerance;Monitored during session;Repositioned    Home Living Family/patient expects to be discharged to:: Private residence Living Arrangements: Spouse/significant other Available Help at Discharge: Family Type of Home: House Home Access: Stairs to enter Entrance Stairs-Rails: Can reach both Entrance Stairs-Number of Steps: 3 Home Layout: One level Home Equipment: None      Prior Function Level of Independence: Independent         Comments: Indep with ADLs, household and community distances without assist device; recently requiring assist from wife for ADLs, household responsibilities due to L UE injury.  Endorses at least 3 falls within previous six weeks (wife reports both during over night hours when patient up OOB alone)     Hand Dominance        Extremity/Trunk Assessment   Upper Extremity Assessment  Upper Extremity Assessment: (L UE immobilized in sling, generally edematous; R UE grossly wFL)    Lower Extremity Assessment Lower Extremity Assessment: Overall  WFL for tasks assessed       Communication   Communication: No difficulties  Cognition Arousal/Alertness: Awake/alert Behavior During Therapy: WFL for tasks assessed/performed Overall Cognitive Status: Within Functional Limits for tasks assessed                                        General Comments      Exercises Other Exercises Other Exercises: 100' with SPC, cga/min assist-decreased stability, control and balance reactions; prefer loftstrand over SPC (patient/wife in agreement) Other Exercises: toilet transfer, ambulatory with loftstrand, cga/min assist; sit/stand from standard toilet height, min assist (may benefit from use of BCS to elevate height, provide armrests).  Assisted with clothing change, ADL due to bladder incontinence, mod/max assist due to L UE immobilization.  May benefit from OT consult to address self-care strategies.   Assessment/Plan    PT Assessment Patient needs continued PT services  PT Problem List Decreased strength;Decreased range of motion;Decreased activity tolerance;Decreased balance;Decreased mobility;Decreased cognition;Decreased knowledge of use of DME;Decreased safety awareness;Decreased knowledge of precautions;Pain       PT Treatment Interventions DME instruction;Gait training;Stair training;Functional mobility training;Therapeutic activities;Therapeutic exercise;Balance training;Cognitive remediation;Patient/family education    PT Goals (Current goals can be found in the Care Plan section)  Acute Rehab PT Goals Patient Stated Goal: to return home, planning to start services with [facility] next date PT Goal Formulation: With patient/family Time For Goal Achievement: 10/04/17 Potential to Achieve Goals: Good    Frequency 7X/week   Barriers to discharge Decreased caregiver support      Co-evaluation               AM-PAC PT "6 Clicks" Daily Activity  Outcome Measure Difficulty turning over in bed (including adjusting  bedclothes, sheets and blankets)?: A Little Difficulty moving from lying on back to sitting on the side of the bed? : A Little Difficulty sitting down on and standing up from a chair with arms (e.g., wheelchair, bedside commode, etc,.)?: A Little Help needed moving to and from a bed to chair (including a wheelchair)?: A Little Help needed walking in hospital room?: A Little Help needed climbing 3-5 steps with a railing? : A Little 6 Click Score: 18    End of Session Equipment Utilized During Treatment: Gait belt Activity Tolerance: Patient tolerated treatment well Patient left: in bed;with call bell/phone within reach;with bed alarm set;with family/visitor present Nurse Communication: Mobility status PT Visit Diagnosis: Muscle weakness (generalized) (M62.81);Difficulty in walking, not elsewhere classified (R26.2)    Time: 1610-9604 PT Time Calculation (min) (ACUTE ONLY): 42 min   Charges:   PT Evaluation $PT Eval Moderate Complexity: 1 Mod PT Treatments $Gait Training: 8-22 mins $Therapeutic Activity: 23-37 mins   PT G Codes:       Edie Vallandingham H. Manson Passey, PT, DPT, NCS 09/20/17, 12:04 PM [phone]

## 2017-09-20 NOTE — Care Management CC44 (Signed)
Condition Code 44 Documentation Completed  Patient Details  Name: Brett Lam MRN: [id number] Date of Birth: 01/18/1946   Condition Code 44 given:  Yes Patient signature on Condition Code 44 notice:  Yes Documentation of 2 MD's agreement:  Yes Code 44 added to claim:  Yes    Chapman Fitch, RN 09/20/2017, 1:19 PM

## 2017-09-20 NOTE — Progress Notes (Signed)
ANTICOAGULATION CONSULT NOTE  Pharmacy Consult for apixaban Indication: VTE/prophylaxis  No Known Allergies  Patient Measurements: Height: 5\' 9"  (175.3 cm) Weight: 171 lb (77.6 kg) IBW/kg (Calculated) : 70.7 Heparin Dosing Weight: 77 kg  Vital Signs: Temp: 97.7 F (36.5 C) (05/05 0719) Temp Source: Oral (05/05 0719) BP: 141/90 (05/05 0719) Pulse Rate: 50 (05/05 0719)  Labs: Recent Labs    09/19/17 0240 09/19/17 0726 09/19/17 1434 09/20/17 0031  HGB 12.4*  --   --  12.6*  HCT 36.6*  --   --  37.5*  PLT 245  --   --  275  APTT  --  33  --   --   LABPROT 13.7  --   --   --   INR 1.06  --   --   --   HEPARINUNFRC  --   --  0.24* 0.24*  CREATININE 1.19  --   --   --     Estimated Creatinine Clearance: 56.1 mL/min (by C-G formula based on SCr of 1.19 mg/dL).   Medical History: Past Medical History:  Diagnosis Date  . Anxiety   . Dizzy spells   . Elephantiasis   . Elevated PSA   . Enlarged prostate   . Genital herpes   . GERD (gastroesophageal reflux disease)    better since wt loss  . H/O flexible sigmoidoscopy 2002   rectal bleeding   . Hyperlipidemia   . Hypertension   . Osteoarthritis    better since wt loss  . Patient is Jehovah's Witness   . Rectal bleed   . Sleep apnea    cpap - does not need since wt loss  . Urinary frequency   . Venous stasis   . Wears dentures    partial upper and lower    Medications:  No anticoagulation in PTA meds.  Assessment: 72 y/o male on heparin drip for DVT to transition to apixaban.    Plan:  Will begin apixaban 10 mg bid x 7 days followed by 5 mg bid. First dose of apixaban at time of stopping heparin infusion.   61, PharmD, BCPS Clinical Pharmacist 09/20/2017 8:06 AM

## 2017-09-20 NOTE — Progress Notes (Signed)
ANTICOAGULATION CONSULT NOTE - Initial Consult  Pharmacy Consult for heparin drip Indication: VTE/prophylaxis  No Known Allergies  Patient Measurements: Height: 5\' 9"  (175.3 cm) Weight: 170 lb (77.1 kg) IBW/kg (Calculated) : 70.7 Heparin Dosing Weight: 77 kg  Vital Signs: Temp: 98.4 F (36.9 C) (05/04 2228) Temp Source: Oral (05/04 2228) BP: 160/85 (05/04 2228) Pulse Rate: 53 (05/04 2228)  Labs: Recent Labs    09/19/17 0240 09/19/17 0726 09/19/17 1434 09/20/17 0031  HGB 12.4*  --   --  12.6*  HCT 36.6*  --   --  37.5*  PLT 245  --   --  275  APTT  --  33  --   --   LABPROT 13.7  --   --   --   INR 1.06  --   --   --   HEPARINUNFRC  --   --  0.24* 0.24*  CREATININE 1.19  --   --   --     Estimated Creatinine Clearance: 56.1 mL/min (by C-G formula based on SCr of 1.19 mg/dL).   Medical History: Past Medical History:  Diagnosis Date  . Anxiety   . Dizzy spells   . Elephantiasis   . Elevated PSA   . Enlarged prostate   . Genital herpes   . GERD (gastroesophageal reflux disease)    better since wt loss  . H/O flexible sigmoidoscopy 2002   rectal bleeding   . Hyperlipidemia   . Hypertension   . Osteoarthritis    better since wt loss  . Patient is Jehovah's Witness   . Rectal bleed   . Sleep apnea    cpap - does not need since wt loss  . Urinary frequency   . Venous stasis   . Wears dentures    partial upper and lower    Medications:  No anticoagulation in PTA meds.  Assessment:  Goal of Therapy:  Heparin level 0.3-0.7 units/ml Monitor platelets by anticoagulation protocol: Yes   Plan:  4600 unit bolus and initial rate of 1200 units/hr. First heparin level 8 hours after start of infusion.  5/4 1511 HL 0.24- Level is subtherapeutic. Will increase infusion to Heparin 1350u/hr and recheck HL in 8 hours.   5/5 0030 heparin level 0.24. 1200 unit bolus and increase rate to 1500 units/hr. Recheck in 8 hours.   2003, PharmD,  BCPS Clinical Pharmacist 09/20/2017 4:06 AM

## 2017-09-20 NOTE — Care Management Obs Status (Deleted)
MEDICARE OBSERVATION STATUS NOTIFICATION   Patient Details  Name: MARLOW BERENGUER MRN: [id number] Date of Birth: [birth year]   Medicare Observation Status Notification Given:  No(admitted obs less than 24 hours)    Chapman Fitch, RN 09/20/2017, 12:49 PM

## 2017-09-20 NOTE — Care Management (Signed)
Patient to discharge today on Eliquis.  Coupon provided for 30 day free supply.  Bedside nurse to include coupon in discharge packet

## 2017-09-21 DIAGNOSIS — S42202A Unspecified fracture of upper end of left humerus, initial encounter for closed fracture: Secondary | ICD-10-CM | POA: Insufficient documentation

## 2017-09-24 NOTE — Discharge Summary (Signed)
[facility] [place] NAME: Brett Lam    MR#:  [id number]  DATE OF BIRTH:  03-Jan-1946  DATE OF ADMISSION:  09/19/2017   ADMITTING PHYSICIAN: Brett Foreman, MD  DATE OF DISCHARGE: 09/20/2017  2:00 PM  PRIMARY CARE PHYSICIAN: Brett Lank, MD   ADMISSION DIAGNOSIS:   Hypokalemia [E87.6] Acute deep vein thrombosis (DVT) of brachial vein of left upper extremity (HCC) [I82.622] Closed nondisplaced fracture of surgical neck of left humerus with routine healing, unspecified fracture morphology, subsequent encounter [S42.215D]  DISCHARGE DIAGNOSIS:   Active Problems:   Acute deep vein thrombosis (DVT) of axillary vein of left upper extremity (HCC)   DVT (deep vein thrombosis) in pregnancy ([place])   SECONDARY DIAGNOSIS:   Past Medical History:  Diagnosis Date  . Anxiety   . Dizzy spells   . Elephantiasis   . Elevated PSA   . Enlarged prostate   . Genital herpes   . GERD (gastroesophageal reflux disease)    better since wt loss  . H/O flexible sigmoidoscopy 2002   rectal bleeding   . Hyperlipidemia   . Hypertension   . Osteoarthritis    better since wt loss  . Patient is Jehovah's Witness   . Rectal bleed   . Sleep apnea    cpap - does not need since wt loss  . Urinary frequency   . Venous stasis   . Wears dentures    partial upper and lower    HOSPITAL COURSE:   72 year old male with past medical history significant for hypertension, sleep apnea, GERD, BPH, diabetes mellitus, recent fall and left humeral fracture was brought to the hospital secondary to dizziness and noted to have left arm DVT.  1.  Left arm DVT-likely triggered by recent fracture and swelling and immobility of the same arm -Doppler showing occlusive venous thrombosis in the brachial veins of the left arm. -Was started on IV heparin in the hospital, appreciate vascular surgery consult.  No thrombectomy recommended. -He was discharged on oral  Eliquis  2.  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  Was not taking his supplements at home.  Replaced in the hospital and was counseled about medication compliance  3.  Left humeral fracture-seen by orthopedics as an outpatient.  Recommended sling.  again patient was not using that like recommended.  4.  Hypertension-blood pressures were elevated.  He continued his lisinopril, Aldactone.  Norvasc was added  5.  BPH-continue Flomax   Patient worked with physical therapy and they have recommended home health  DISCHARGE CONDITIONS:   Guarded  CONSULTS OBTAINED:   Treatment Team:  Brett Mull, MD  DRUG ALLERGIES:   No Known Allergies DISCHARGE MEDICATIONS:   Allergies as of 09/20/2017   No Known Allergies     Medication List    STOP taking these medications   potassium chloride 20 MEQ packet Commonly known as:  KLOR-CON     TAKE these medications   amLODipine 5 MG tablet Commonly known as:  NORVASC Take 1 tablet (5 mg total) by mouth daily.   apixaban Kit Commonly known as:  ELIQUIS FOLLOW INSTRUCTIONS   atorvastatin 40 MG tablet Commonly known as:  LIPITOR Take 40 mg by mouth daily.   docusate sodium 100 MG capsule Commonly known as:  COLACE Take 1 tablet once or twice daily as needed for constipation while taking narcotic pain medicine   donepezil 10 MG tablet Commonly known as:  ARICEPT Take 10 mg by mouth  daily.   ELIQUIS STARTER PACK 5 MG Tabs Take as directed on package: start with two-48m tablets twice daily for 7 days. On day 8, switch to one-512mtablet twice daily.   FLUoxetine 40 MG capsule Commonly known as:  PROZAC Take 1 capsule (40 mg total) by mouth daily.   lisinopril 10 MG tablet Commonly known as:  PRINIVIL,ZESTRIL Take 10 mg by mouth daily.   oxyCODONE-acetaminophen 5-325 MG tablet Commonly known as:  PERCOCET Take 1-2 tablets by mouth every 6 (six) hours as needed for severe pain.   potassium chloride SA 20 MEQ tablet Commonly  known as:  K-DUR,KLOR-CON Take 20 mEq by mouth daily.   spironolactone 25 MG tablet Commonly known as:  ALDACTONE Take 1 tablet (25 mg total) by mouth daily.   tamsulosin 0.4 MG Caps capsule Commonly known as:  FLOMAX Take 0.8 mg by mouth every morning.        DISCHARGE INSTRUCTIONS:   1.  PCP follow-up in 1 to 2 weeks 2.  Orthopedic follow up as prior scheduled  DIET:   Cardiac diet  ACTIVITY:   Activity as tolerated  OXYGEN:   Home Oxygen: No.  Oxygen Delivery: room air  DISCHARGE LOCATION:   home   If you experience worsening of your admission symptoms, develop shortness of breath, life threatening emergency, suicidal or homicidal thoughts you must seek medical attention immediately by calling 911 or calling your MD immediately  if symptoms less severe.  You Must read complete instructions/literature along with all the possible adverse reactions/side effects for all the Medicines you take and that have been prescribed to you. Take any new Medicines after you have completely understood and accpet all the possible adverse reactions/side effects.   Please note  You were cared for by a hospitalist during your hospital stay. If you have any questions about your discharge medications or the care you received while you were in the hospital after you are discharged, you can call the unit and asked to speak with the hospitalist on call if the hospitalist that took care of you is not available. Once you are discharged, your primary care physician will handle any further medical issues. Please note that NO REFILLS for any discharge medications will be authorized once you are discharged, as it is imperative that you return to your primary care physician (or establish a relationship with a primary care physician if you do not have one) for your aftercare needs so that they can reassess your need for medications and monitor your lab values.    On the day of Discharge:  VITAL  SIGNS:   Blood pressure (!) 147/75, pulse (!) 50, temperature 97.7 F (36.5 C), temperature source Oral, resp. rate 17, height _0  (1.753 m), weight 77.6 kg (171 lb), SpO2 100 %.  PHYSICAL EXAMINATION:    GENERAL:  [age].o.-year-old patient sitting in the bed with no acute distress.  EYES: Pupils equal, round, reactive to light and accommodation. No scleral icterus. Extraocular muscles intact.  HEENT: Head atraumatic, normocephalic. Oropharynx and nasopharynx clear.  NECK:  Supple, no jugular venous distention. No thyroid enlargement, no tenderness.  LUNGS: Normal breath sounds bilaterally, no wheezing, rales,rhonchi or crepitation. No use of accessory muscles of respiration.  CARDIOVASCULAR: S1, S2 normal. No  rubs, or gallops. 2/6 systolic murmur present ABDOMEN: Soft, non-tender, non-distended. Bowel sounds present. No organomegaly or mass.  EXTREMITIES: left arm swelling noted, sensation intact. Tenderness and bruising noted. No pedal edema, cyanosis, or clubbing.  NEUROLOGIC: Cranial nerves II through XII are intact. Muscle strength 5/5 in all extremities. Sensation intact. Gait not checked.  PSYCHIATRIC: The patient is alert and oriented x 3.  SKIN: No obvious rash, lesion, or ulcer.   DATA REVIEW:   CBC Recent Labs  Lab 09/20/17 0031  WBC 7.9  HGB 12.6*  HCT 37.5*  PLT 275    Chemistries  Recent Labs  Lab 09/20/17 0031  NA 142  K 3.4*  CL 118*  CO2 20*  GLUCOSE 78  BUN 30*  CREATININE 1.04  CALCIUM 8.0*     Microbiology Results  Results for orders placed or performed in visit on 12/01/14  Microscopic Examination     Status: None   Collection Time: 12/01/14 11:29 AM  Result Value Ref Range Status   WBC, UA 0-5 0 - 5 /hpf Final   RBC, UA 0-2 0 - 2 /hpf Final   Epithelial Cells (non renal) 0-10 0 - 10 /hpf Final   Bacteria, UA None seen None seen/Few Final    RADIOLOGY:  No results found.   Management plans discussed with the patient, family and they  are in agreement.  CODE STATUS:  Code Status History    Date Active Date Inactive Code Status Order ID Comments User Context   09/19/2017 0845 09/20/2017 1709 Full Code [id number]  Brett Foreman, MD Inpatient   06/02/2017 1808 06/04/2017 1514 Full Code [id number]  Demetrios Loll, MD ED   02/06/2015 1815 02/08/2015 1520 Full Code [id number]  Bonner Puna, MD Inpatient    Advance Directive Documentation     Most Recent Value  Type of Advance Directive  Healthcare Power of Attorney  Pre-existing out of facility DNR order (yellow form or pink MOST form)  -  "MOST" Form in Place?  -      TOTAL TIME TAKING CARE OF THIS PATIENT: 38 minutes.    Gladstone Lighter M.D on 09/24/2017 at 2:52 PM  Between 7am to 6pm - Pager - [phone]  After 6pm go to [email or web address] - Proofreader  [facility]  Office  [phone]  CC: Primary care physician; Brett Lank, MD   Note: This dictation was prepared with Dragon dictation along with smaller phrase technology. Any transcriptional errors that result from this process are unintentional.

## 2017-10-14 ENCOUNTER — Ambulatory Visit (INDEPENDENT_AMBULATORY_CARE_PROVIDER_SITE_OTHER): Payer: Medicare (Managed Care) | Admitting: Vascular Surgery

## 2017-10-14 ENCOUNTER — Other Ambulatory Visit (INDEPENDENT_AMBULATORY_CARE_PROVIDER_SITE_OTHER): Payer: Self-pay | Admitting: Vascular Surgery

## 2017-10-14 ENCOUNTER — Other Ambulatory Visit (INDEPENDENT_AMBULATORY_CARE_PROVIDER_SITE_OTHER): Payer: Medicare (Managed Care)

## 2017-10-14 ENCOUNTER — Encounter (INDEPENDENT_AMBULATORY_CARE_PROVIDER_SITE_OTHER): Payer: Self-pay | Admitting: Vascular Surgery

## 2017-10-14 VITALS — BP 147/77 | HR 48 | Resp 16 | Ht 67.0 in | Wt 176.0 lb

## 2017-10-14 DIAGNOSIS — I82A12 Acute embolism and thrombosis of left axillary vein: Secondary | ICD-10-CM

## 2017-10-14 DIAGNOSIS — I82602 Acute embolism and thrombosis of unspecified veins of left upper extremity: Secondary | ICD-10-CM

## 2017-10-14 NOTE — Progress Notes (Signed)
Subjective:    Patient ID: Brett Lam, male    DOB: 1945/07/11, 72 y.o.   MRN: [id number] Chief Complaint  Patient presents with  . Follow-up    2 week Venous DVT study    Patient presents for his first visit after being discharged from the hospital.  The patient was seen as a consult on Sep 19, 2017.  At the time the patient was found to have a occlusive thrombus in the left brachial vein.  The patient was placed on Eliquis 5 mg 1 tab p.o. twice daily.  The patient presents today with some left upper extremity pain still from the fall.  Arm is in a splint.  The patient underwent a left upper extremity venous duplex which was notable for previous DVT now resolved.  No evidence of acute deep vein thrombosis.  Unable to evaluate the left basilic vein due to difficulty due to the patient left arm discomfort and immobility.  Patient denies any shortness of breath or chest pain.  Patient denies any fever, nausea vomiting.  Review of Systems  Constitutional: Negative.   HENT: Negative.   Eyes: Negative.   Respiratory: Negative.   Cardiovascular:       DVT  Gastrointestinal: Negative.   Endocrine: Negative.   Genitourinary: Negative.   Musculoskeletal: Negative.   Skin: Negative.   Allergic/Immunologic: Negative.   Neurological: Negative.   Hematological: Negative.   Psychiatric/Behavioral: Negative.       Objective:   Physical Exam  Constitutional: He is oriented to person, place, and time. He appears well-developed and well-nourished. No distress.  HENT:  Head: Normocephalic and atraumatic.  Right Ear: External ear normal.  Left Ear: External ear normal.  Eyes: Pupils are equal, round, and reactive to light. Conjunctivae and EOM are normal.  Neck: Normal range of motion.  Cardiovascular: Normal rate, regular rhythm, normal heart sounds and intact distal pulses.  Pulses:      Radial pulses are 2+ on the right side, and 2+ on the left side.  There is no acute vascular  compromise to the left upper extremity  Pulmonary/Chest: Effort normal and breath sounds normal.  Musculoskeletal: Normal range of motion.  Neurological: He is alert and oriented to person, place, and time.  Skin: Skin is warm and dry. He is not diaphoretic.  Psychiatric: He has a normal mood and affect. His behavior is normal. Judgment and thought content normal.  Vitals reviewed.  BP (!) 147/77 (BP Location: Right Arm, Patient Position: Sitting)   Pulse (!) 48   Resp 16   Ht 5' 7" (1.702 m)   Wt 176 lb (79.8 kg)   BMI 27.57 kg/m   Past Medical History:  Diagnosis Date  . Anxiety   . Dizzy spells   . Elephantiasis   . Elevated PSA   . Enlarged prostate   . Genital herpes   . GERD (gastroesophageal reflux disease)    better since wt loss  . H/O flexible sigmoidoscopy 2002   rectal bleeding   . Hyperlipidemia   . Hypertension   . Osteoarthritis    better since wt loss  . Patient is Jehovah's Witness   . Rectal bleed   . Sleep apnea    cpap - does not need since wt loss  . Urinary frequency   . Venous stasis   . Wears dentures    partial upper and lower   Social History   Socioeconomic History  . Marital status: Married  Spouse name: Not on file  . Number of children: Not on file  . Years of education: Not on file  . Highest education level: Not on file  Occupational History  . Not on file  Social Needs  . Financial resource strain: Not on file  . Food insecurity:    Worry: Not on file    Inability: Not on file  . Transportation needs:    Medical: Not on file    Non-medical: Not on file  Tobacco Use  . Smoking status: Former Smoker    Packs/day: 1.00    Years: 6.00    Pack years: 6.00    Types: Cigarettes    Last attempt to quit: 01/26/1971    Years since quitting: 46.7  . Smokeless tobacco: Never Used  Substance and Sexual Activity  . Alcohol use: No  . Drug use: No  . Sexual activity: Not on file  Lifestyle  . Physical activity:    Days per  week: Not on file    Minutes per session: Not on file  . Stress: Not on file  Relationships  . Social connections:    Talks on phone: Not on file    Gets together: Not on file    Attends religious service: Not on file    Active member of club or organization: Not on file    Attends meetings of clubs or organizations: Not on file    Relationship status: Not on file  . Intimate partner violence:    Fear of current or ex partner: Not on file    Emotionally abused: Not on file    Physically abused: Not on file    Forced sexual activity: Not on file  Other Topics Concern  . Not on file  Social History Narrative  . Not on file   Past Surgical History:  Procedure Laterality Date  . COLONOSCOPY    . COLONOSCOPY WITH PROPOFOL N/A 07/23/2015   Procedure: COLONOSCOPY WITH PROPOFOL;  Surgeon: Lucilla Lame, MD;  Location: [facility];  Service: Endoscopy;  Laterality: N/A;  . LAPAROSCOPIC GASTRIC RESTRICTIVE DUODENAL PROCEDURE (DUODENAL SWITCH) N/A 02/06/2015   Procedure: LAPAROSCOPIC GASTRIC RESTRICTIVE DUODENAL PROCEDURE (DUODENAL SWITCH);  Surgeon: Bonner Puna, MD;  Location: [facility];  Service: General;  Laterality: N/A;  . LIPOMA EXCISION  2004  . VEIN SURGERY Bilateral    legs   Family History  Problem Relation Age of Onset  . Hypertension Other    Allergies  Allergen Reactions  . Chlorpheniramine       Assessment & Plan:  Patient presents for his first visit after being discharged from the hospital.  The patient was seen as a consult on Sep 19, 2017.  At the time the patient was found to have a occlusive thrombus in the left brachial vein.  The patient was placed on Eliquis 5 mg 1 tab p.o. twice daily.  The patient presents today with some left upper extremity pain still from the fall.  Arm is in a splint.  The patient underwent a left upper extremity venous duplex which was notable for previous DVT now resolved.  No evidence of acute deep vein thrombosis.  Unable to evaluate  the left basilic vein due to difficulty due to the patient left arm discomfort and immobility.  Patient denies any shortness of breath or chest pain.  Patient denies any fever, nausea vomiting.  1. Acute deep vein thrombosis (DVT) of axillary vein of left upper extremity (HCC) - Stable Patient  with left brachial vein resolvent on duplex today. Would recommend continuing Eliquis 5 mg 1 tab p.o. twice daily for at least 3 months to prevent any risk of recurrence. Bring the patient back in 2 months to repeat a left upper extremity DVT study if no acute/chronic thrombus is noted at that point the patient can most likely come off of oral anticoagulation.  - VAS [place] UPPER EXTREMITY VENOUS DUPLEX; Future  Current Outpatient Medications on File Prior to Visit  Medication Sig Dispense Refill  . amLODipine (NORVASC) 5 MG tablet Take 1 tablet (5 mg total) by mouth daily. 30 tablet 2  . apixaban (ELIQUIS) KIT FOLLOW INSTRUCTIONS (Patient taking differently: 5 each. FOLLOW INSTRUCTIONS) 1 kit 0  . atorvastatin (LIPITOR) 40 MG tablet Take 40 mg by mouth daily.    Marland Kitchen docusate sodium (COLACE) 100 MG capsule Take 1 tablet once or twice daily as needed for constipation while taking narcotic pain medicine 30 capsule 0  . donepezil (ARICEPT) 10 MG tablet Take 10 mg by mouth daily.    Marland Kitchen doxazosin (CARDURA) 4 MG tablet doxazosin 4 mg tablet    . ELIQUIS STARTER PACK (ELIQUIS STARTER PACK) 5 MG TABS Take as directed on package: start with two-14m tablets twice daily for 7 days. On day 8, switch to one-533mtablet twice daily. 1 each 0  . finasteride (PROSCAR) 5 MG tablet finasteride 5 mg tablet    . FLUoxetine (PROZAC) 40 MG capsule Take 1 capsule (40 mg total) by mouth daily. 30 capsule 3  . ibuprofen (ADVIL,MOTRIN) 800 MG tablet     . lisinopril (PRINIVIL,ZESTRIL) 10 MG tablet Take 10 mg by mouth daily.    . Marland Kitchenisinopril-hydrochlorothiazide (PRINZIDE,ZESTORETIC) 20-12.5 MG tablet     . lovastatin (MEVACOR) 20 MG tablet      . oxyCODONE-acetaminophen (PERCOCET) 5-325 MG tablet Take 1-2 tablets by mouth every 6 (six) hours as needed for severe pain. 30 tablet 0  . potassium chloride SA (K-DUR,KLOR-CON) 20 MEQ tablet Take 20 mEq by mouth daily.    . prednisoLONE acetate (PRED FORTE) 1 % ophthalmic suspension   0  . sertraline (ZOLOFT) 100 MG tablet     . spironolactone (ALDACTONE) 25 MG tablet Take 1 tablet (25 mg total) by mouth daily. 30 tablet 2  . tamsulosin (FLOMAX) 0.4 MG CAPS Take 0.8 mg by mouth every morning.      No current facility-administered medications on file prior to visit.    There are no Patient Instructions on file for this visit. No follow-ups on file.   A , PA-C

## 2018-01-11 ENCOUNTER — Encounter (INDEPENDENT_AMBULATORY_CARE_PROVIDER_SITE_OTHER): Payer: Self-pay | Admitting: Nurse Practitioner

## 2018-01-11 ENCOUNTER — Ambulatory Visit (INDEPENDENT_AMBULATORY_CARE_PROVIDER_SITE_OTHER): Payer: Medicare (Managed Care)

## 2018-01-11 ENCOUNTER — Ambulatory Visit (INDEPENDENT_AMBULATORY_CARE_PROVIDER_SITE_OTHER): Payer: Medicare (Managed Care) | Admitting: Nurse Practitioner

## 2018-01-11 VITALS — BP 130/76 | HR 47 | Resp 16 | Ht 67.0 in | Wt 175.6 lb

## 2018-01-11 DIAGNOSIS — I82A12 Acute embolism and thrombosis of left axillary vein: Secondary | ICD-10-CM

## 2018-01-11 DIAGNOSIS — E785 Hyperlipidemia, unspecified: Secondary | ICD-10-CM | POA: Diagnosis not present

## 2018-01-11 DIAGNOSIS — I10 Essential (primary) hypertension: Secondary | ICD-10-CM | POA: Diagnosis not present

## 2018-01-11 NOTE — Progress Notes (Signed)
Subjective:    Patient ID: Brett Lam, male    DOB: [birth year], 72 y.o.   MRN: [id number] Chief Complaint  Patient presents with  . Follow-up    left ue dvt ultrasound    HPI  Brett Lam is a 72 y.o. male who presents for follow-up of a left upper extremity DVT. Symptom onset has been resolved for a time period of 5 month(s). Severity is described as none. Course of his symptoms over time is resolved.   The patient does not have any left upper extremity swelling currently.  The patient suffered a fall shortly after discovery of the DVT.  He is still recuperating from that fall.  He continues to have some pain with motion, however he is currently working with physical therapy.  He denies any chest pain or shortness of breath.  He denies any fever, chills, nausea, vomiting, diarrhea.  He denies amaurosis fugax, dizziness, or lightheadedness.  The patient underwent a left upper extremity venous duplex today.  Which showed no evidence of deep vein thrombosis in the upper extremity.  No evidence of superficial vein thrombosis in the upper extremity.  No evidence of thrombosis in the subclavian.  This is consistent with previous duplex findings on 10/14/2017.  Constitutional: '[]' Weight loss  '[]' Fever  '[]' Chills Cardiac: '[]' Chest pain   '[]' Chest pressure   '[]' Palpitations   '[]' Shortness of breath when laying flat   '[]' Shortness of breath with exertion. Vascular:  '[]' Pain in legs with walking   '[]' Pain in legs with standing  '[x]' History of DVT   '[]' Phlebitis   '[]' Swelling in legs   '[]' Varicose veins   '[]' Non-healing ulcers Pulmonary:   '[]' Uses home oxygen   '[]' Productive cough   '[]' Hemoptysis   '[]' Wheeze  '[]' COPD   '[]' Asthma Neurologic:  '[]' Dizziness   '[]' Seizures   '[]' History of stroke   '[]' History of TIA  '[]' Aphasia   '[]' Vissual changes   '[]' Weakness or numbness in arm   '[]' Weakness or numbness in leg Musculoskeletal:   '[]' Joint swelling   '[]' Joint pain   '[]' Low back pain Hematologic:  '[]' Easy bruising  '[]' Easy bleeding    '[]' Hypercoagulable state   '[]' Anemic Gastrointestinal:  '[]' Diarrhea   '[]' Vomiting  '[]' Gastroesophageal reflux/heartburn   '[]' Difficulty swallowing. Genitourinary:  '[]' Chronic kidney disease   '[]' Difficult urination  '[]' Frequent urination   '[]' Blood in urine Skin:  '[]' Rashes   '[]' Ulcers  Psychological:  '[]' History of anxiety   '[]'  History of major depression.     Objective:   Physical Exam  BP 130/76 (BP Location: Right Arm)   Pulse (!) 47   Resp 16   Ht '5\' 7"'  (1.702 m)   Wt 175 lb 9.6 oz (79.7 kg)   BMI 27.50 kg/m   Past Medical History:  Diagnosis Date  . Anxiety   . Dizzy spells   . Elephantiasis   . Elevated PSA   . Enlarged prostate   . Genital herpes   . GERD (gastroesophageal reflux disease)    better since wt loss  . H/O flexible sigmoidoscopy 2002   rectal bleeding   . Hyperlipidemia   . Hypertension   . Osteoarthritis    better since wt loss  . Patient is Jehovah's Witness   . Rectal bleed   . Sleep apnea    cpap - does not need since wt loss  . Urinary frequency   . Venous stasis   . Wears dentures    partial upper and lower     Gen: WD/WN, NAD Head: [place]/AT, No  temporalis wasting.  Ear/Nose/Throat: Hearing grossly intact, nares w/o erythema or drainage Eyes: PER, EOMI, sclera nonicteric.  Neck: Supple, no masses.  No JVD.  Pulmonary:  Good air movement, no use of accessory muscles.  Cardiac: RRR Vascular:  Vessel Right Left  Radial  2+ palpable  2+ palpable  Brachial  palpable  palpable  Gastrointestinal: soft, non-distended. No guarding/no peritoneal signs.  Musculoskeletal: M/S 5/5 throughout.  No deformity or atrophy.  Neurologic: Pain and light touch intact in extremities.  Symmetrical.  Speech is fluent. Motor exam as listed above. Psychiatric: Judgment intact, Mood & affect appropriate for pt's clinical situation. Dermatologic: No Venous rashes. No Ulcers Noted.  No changes consistent with cellulitis. Lymph : No Cervical lymphadenopathy, no  lichenification or skin changes of chronic lymphedema.   Social History   Socioeconomic History  . Marital status: Married    Spouse name: Not on file  . Number of children: Not on file  . Years of education: Not on file  . Highest education level: Not on file  Occupational History  . Not on file  Social Needs  . Financial resource strain: Not on file  . Food insecurity:    Worry: Not on file    Inability: Not on file  . Transportation needs:    Medical: Not on file    Non-medical: Not on file  Tobacco Use  . Smoking status: Former Smoker    Packs/day: 1.00    Years: 6.00    Pack years: 6.00    Types: Cigarettes    Last attempt to quit: 01/26/1971    Years since quitting: 46.9  . Smokeless tobacco: Never Used  Substance and Sexual Activity  . Alcohol use: No  . Drug use: No  . Sexual activity: Not on file  Lifestyle  . Physical activity:    Days per week: Not on file    Minutes per session: Not on file  . Stress: Not on file  Relationships  . Social connections:    Talks on phone: Not on file    Gets together: Not on file    Attends religious service: Not on file    Active member of club or organization: Not on file    Attends meetings of clubs or organizations: Not on file    Relationship status: Not on file  . Intimate partner violence:    Fear of current or ex partner: Not on file    Emotionally abused: Not on file    Physically abused: Not on file    Forced sexual activity: Not on file  Other Topics Concern  . Not on file  Social History Narrative  . Not on file    Past Surgical History:  Procedure Laterality Date  . COLONOSCOPY    . COLONOSCOPY WITH PROPOFOL N/A 07/23/2015   Procedure: COLONOSCOPY WITH PROPOFOL;  Surgeon: Lucilla Lame, MD;  Location: [place];  Service: Endoscopy;  Laterality: N/A;  . LAPAROSCOPIC GASTRIC RESTRICTIVE DUODENAL PROCEDURE (DUODENAL SWITCH) N/A 02/06/2015   Procedure: LAPAROSCOPIC GASTRIC RESTRICTIVE DUODENAL  PROCEDURE (DUODENAL SWITCH);  Surgeon: Bonner Puna, MD;  Location: [facility];  Service: General;  Laterality: N/A;  . LIPOMA EXCISION  2004  . VEIN SURGERY Bilateral    legs    Family History  Problem Relation Age of Onset  . Hypertension Other     Allergies  Allergen Reactions  . Chlorpheniramine        Assessment & Plan:   1. Acute deep vein  thrombosis (DVT) of axillary vein of left upper extremity ([place]) The patient underwent a left upper extremity venous duplex today.  Which showed no evidence of deep vein thrombosis in the upper extremity.  No evidence of superficial vein thrombosis in the upper extremity.  No evidence of thrombosis in the subclavian.  This is consistent with previous duplex findings on 10/14/2017.  Patient originally had the DVT found on 09/19/2017.  He was started on Eliquis at that time.  It is reasonable for him to complete his anticoagulation therapy at this time.  The patient and his wife were advised that should he begin to suffer swelling, pain, redness of that arm again he should contact us to have it evaluated as soon as possible.  Patient and his wife understood.  2. Essential (primary) hypertension Continue antihypertensive medications as already ordered, these medications have been reviewed and there are no changes at this time.   3. Hyperlipidemia, unspecified hyperlipidemia type Continue statin as ordered and reviewed, no changes at this time   Current Outpatient Medications on File Prior to Visit  Medication Sig Dispense Refill  . apixaban (ELIQUIS) KIT FOLLOW INSTRUCTIONS (Patient taking differently: 5 each. FOLLOW INSTRUCTIONS) 1 kit 0  . atorvastatin (LIPITOR) 40 MG tablet Take 40 mg by mouth daily.    Marland Kitchen donepezil (ARICEPT) 10 MG tablet Take 10 mg by mouth daily.    Marland Kitchen ELIQUIS STARTER PACK (ELIQUIS STARTER PACK) 5 MG TABS Take as directed on package: start with two-68m tablets twice daily for 7 days. On day 8, switch to one-552mtablet twice  daily. 1 each 0  . FLUoxetine (PROZAC) 40 MG capsule Take 1 capsule (40 mg total) by mouth daily. 30 capsule 3  . lisinopril (PRINIVIL,ZESTRIL) 10 MG tablet Take 10 mg by mouth daily.    . potassium chloride SA (K-DUR,KLOR-CON) 20 MEQ tablet Take 20 mEq by mouth daily.    . tamsulosin (FLOMAX) 0.4 MG CAPS Take 0.8 mg by mouth every morning.     . Marland KitchenmLODipine (NORVASC) 5 MG tablet Take 1 tablet (5 mg total) by mouth daily. (Patient not taking: Reported on 01/11/2018) 30 tablet 2  . docusate sodium (COLACE) 100 MG capsule Take 1 tablet once or twice daily as needed for constipation while taking narcotic pain medicine (Patient not taking: Reported on 01/11/2018) 30 capsule 0  . doxazosin (CARDURA) 4 MG tablet doxazosin 4 mg tablet    . finasteride (PROSCAR) 5 MG tablet finasteride 5 mg tablet    . ibuprofen (ADVIL,MOTRIN) 800 MG tablet     . lisinopril-hydrochlorothiazide (PRINZIDE,ZESTORETIC) 20-12.5 MG tablet     . lovastatin (MEVACOR) 20 MG tablet     . oxyCODONE-acetaminophen (PERCOCET) 5-325 MG tablet Take 1-2 tablets by mouth every 6 (six) hours as needed for severe pain. (Patient not taking: Reported on 01/11/2018) 30 tablet 0  . prednisoLONE acetate (PRED FORTE) 1 % ophthalmic suspension   0  . sertraline (ZOLOFT) 100 MG tablet     . spironolactone (ALDACTONE) 25 MG tablet Take 1 tablet (25 mg total) by mouth daily. (Patient not taking: Reported on 01/11/2018) 30 tablet 2   No current facility-administered medications on file prior to visit.     There are no Patient Instructions on file for this visit. No follow-ups on file.   FaKris HartmannNP

## 2019-04-29 ENCOUNTER — Other Ambulatory Visit: Payer: Self-pay

## 2019-04-29 ENCOUNTER — Other Ambulatory Visit (INDEPENDENT_AMBULATORY_CARE_PROVIDER_SITE_OTHER): Payer: Self-pay | Admitting: Nurse Practitioner

## 2019-04-29 ENCOUNTER — Encounter (INDEPENDENT_AMBULATORY_CARE_PROVIDER_SITE_OTHER): Payer: Self-pay | Admitting: Nurse Practitioner

## 2019-04-29 ENCOUNTER — Ambulatory Visit (INDEPENDENT_AMBULATORY_CARE_PROVIDER_SITE_OTHER): Payer: Medicare (Managed Care)

## 2019-04-29 ENCOUNTER — Ambulatory Visit (INDEPENDENT_AMBULATORY_CARE_PROVIDER_SITE_OTHER): Payer: Medicare (Managed Care) | Admitting: Nurse Practitioner

## 2019-04-29 VITALS — BP 138/78 | HR 50 | Resp 16 | Wt 165.0 lb

## 2019-04-29 DIAGNOSIS — E785 Hyperlipidemia, unspecified: Secondary | ICD-10-CM | POA: Diagnosis not present

## 2019-04-29 DIAGNOSIS — I890 Lymphedema, not elsewhere classified: Secondary | ICD-10-CM

## 2019-04-29 DIAGNOSIS — I83023 Varicose veins of left lower extremity with ulcer of ankle: Secondary | ICD-10-CM

## 2019-04-29 DIAGNOSIS — I10 Essential (primary) hypertension: Secondary | ICD-10-CM

## 2019-04-29 DIAGNOSIS — L97329 Non-pressure chronic ulcer of left ankle with unspecified severity: Secondary | ICD-10-CM

## 2019-04-29 DIAGNOSIS — R6889 Other general symptoms and signs: Secondary | ICD-10-CM | POA: Diagnosis not present

## 2019-05-02 ENCOUNTER — Telehealth (INDEPENDENT_AMBULATORY_CARE_PROVIDER_SITE_OTHER): Payer: Self-pay

## 2019-05-02 NOTE — Telephone Encounter (Signed)
Spoke with the wife and explained that she will need to let  [facility] know her wishes to have homehealth come in and provide unna boots for the patient.

## 2019-05-05 ENCOUNTER — Encounter (INDEPENDENT_AMBULATORY_CARE_PROVIDER_SITE_OTHER): Payer: Self-pay | Admitting: Nurse Practitioner

## 2019-05-05 NOTE — Progress Notes (Signed)
SUBJECTIVE:  Patient ID: Brett Lam, male    DOB: Oct 10, 1945, 73 y.o.   MRN: [id number] Chief Complaint  Patient presents with  . New Patient (Initial Visit)    ref [facility] compression therapy    HPI  Brett Lam is a 73 y.o. male that presents today with concern for lower extremity edema as well as ulceration.  The patient's wife states that patient's leg became very swollen about 3 to 4 weeks ago and suddenly a blister bubbled up on the left lower extremity.  This blister ruptured leaving an open area.  Since then the wound has been very slow to heal and painful.  Patient does have a history of vascular dementia and so he is not very verbal during this visit.  Patient's wife denies any fever, chills, nausea, vomiting or diarrhea.  Patient has attempted to utilize medical grade 1 compression stockings however due to the extensive swelling they are unable to place them.  Today the patient underwent noninvasive studies which revealed an ABI of 1.17 on the right lower extremity with an ABI 1.20 on the left.  The patient had triphasic tibial artery waveforms bilaterally with good toe waveforms bilaterally. Past Medical History:  Diagnosis Date  . Anxiety   . Dizzy spells   . Elephantiasis   . Elevated PSA   . Enlarged prostate   . Genital herpes   . GERD (gastroesophageal reflux disease)    better since wt loss  . H/O flexible sigmoidoscopy 2002   rectal bleeding   . Hyperlipidemia   . Hypertension   . Osteoarthritis    better since wt loss  . Patient is Jehovah's Witness   . Rectal bleed   . Sleep apnea    cpap - does not need since wt loss  . Urinary frequency   . Venous stasis   . Wears dentures    partial upper and lower    Past Surgical History:  Procedure Laterality Date  . COLONOSCOPY    . COLONOSCOPY WITH PROPOFOL N/A 07/23/2015   Procedure: COLONOSCOPY WITH PROPOFOL;  Surgeon: Lucilla Lame, MD;  Location: [place];  Service: Endoscopy;   Laterality: N/A;  . LAPAROSCOPIC GASTRIC RESTRICTIVE DUODENAL PROCEDURE (DUODENAL SWITCH) N/A 02/06/2015   Procedure: LAPAROSCOPIC GASTRIC RESTRICTIVE DUODENAL PROCEDURE (DUODENAL SWITCH);  Surgeon: Bonner Puna, MD;  Location: [facility];  Service: General;  Laterality: N/A;  . LIPOMA EXCISION  2004  . VEIN SURGERY Bilateral    legs    Social History   Socioeconomic History  . Marital status: Married    Spouse name: Not on file  . Number of children: Not on file  . Years of education: Not on file  . Highest education level: Not on file  Occupational History  . Not on file  Tobacco Use  . Smoking status: Former Smoker    Packs/day: 1.00    Years: 6.00    Pack years: 6.00    Types: Cigarettes    Quit date: 01/26/1971    Years since quitting: 48.3  . Smokeless tobacco: Never Used  Substance and Sexual Activity  . Alcohol use: No  . Drug use: No  . Sexual activity: Not on file  Other Topics Concern  . Not on file  Social History Narrative  . Not on file   Social Determinants of Health   Financial Resource Strain:   . Difficulty of Paying Living Expenses: Not on file  Food Insecurity:   . Worried  About Running Out of Food in the Last Year: Not on file  . Ran Out of Food in the Last Year: Not on file  Transportation Needs:   . Lack of Transportation (Medical): Not on file  . Lack of Transportation (Non-Medical): Not on file  Physical Activity:   . Days of Exercise per Week: Not on file  . Minutes of Exercise per Session: Not on file  Stress:   . Feeling of Stress : Not on file  Social Connections:   . Frequency of Communication with Friends and Family: Not on file  . Frequency of Social Gatherings with Friends and Family: Not on file  . Attends Religious Services: Not on file  . Active Member of Clubs or Organizations: Not on file  . Attends Archivist Meetings: Not on file  . Marital Status: Not on file  Intimate Partner Violence:   . Fear of Current or  Ex-Partner: Not on file  . Emotionally Abused: Not on file  . Physically Abused: Not on file  . Sexually Abused: Not on file    Family History  Problem Relation Age of Onset  . Hypertension Other     Allergies  Allergen Reactions  . Chlorpheniramine      Review of Systems   Review of Systems: Negative Unless Checked Constitutional: '[]' Weight loss  '[]' Fever  '[]' Chills Cardiac: '[]' Chest pain   '[]'  Atrial Fibrillation  '[]' Palpitations   '[]' Shortness of breath when laying flat   '[]' Shortness of breath with exertion. '[]' Shortness of breath at rest Vascular:  '[]' Pain in legs with walking   '[]' Pain in legs with standing '[]' Pain in legs when laying flat   '[]' Claudication    '[]' Pain in feet when laying flat    '[]' History of DVT   '[]' Phlebitis   '[x]' Swelling in legs   '[x]' Varicose veins   '[x]' Non-healing ulcers Pulmonary:   '[]' Uses home oxygen   '[]' Productive cough   '[]' Hemoptysis   '[]' Wheeze  '[]' COPD   '[]' Asthma Neurologic:  '[]' Dizziness   '[]' Seizures  '[]' Blackouts '[]' History of stroke   '[]' History of TIA  '[]' Aphasia   '[]' Temporary Blindness   '[]' Weakness or numbness in arm   '[]' Weakness or numbness in leg Musculoskeletal:   '[]' Joint swelling   '[]' Joint pain   '[]' Low back pain  '[]'  History of Knee Replacement '[]' Arthritis '[]' back Surgeries  '[]'  Spinal Stenosis    Hematologic:  '[]' Easy bruising  '[]' Easy bleeding   '[]' Hypercoagulable state   '[]' Anemic Gastrointestinal:  '[]' Diarrhea   '[]' Vomiting  '[x]' Gastroesophageal reflux/heartburn   '[]' Difficulty swallowing. '[]' Abdominal pain Genitourinary:  '[]' Chronic kidney disease   '[]' Difficult urination  '[]' Anuric   '[]' Blood in urine '[]' Frequent urination  '[]' Burning with urination   '[]' Hematuria Skin:  '[]' Rashes   '[]' Ulcers '[]' Wounds Psychological:  '[x]' History of anxiety   '[]'  History of major depression  '[x]'  Memory Difficulties      OBJECTIVE:   Physical Exam  BP 138/78 (BP Location: Right Arm)   Pulse (!) 50   Resp 16   Wt 165 lb (74.8 kg)   BMI 25.84 kg/m   Gen: WD/WN, NAD Head: [place]/AT, No  temporalis wasting.  Ear/Nose/Throat: Hearing grossly intact, nares w/o erythema or drainage Eyes: PER, EOMI, sclera nonicteric.  Neck: Supple, no masses.  No JVD.  Pulmonary:  Good air movement, no use of accessory muscles.  Cardiac: RRR Vascular:  3+ pitting edema bilaterally with venous stasis ulcer on left lower extremity.  Bilateral stasis dermatitis Vessel Right Left  Radial Palpable Palpable  Dorsalis Pedis Palpable Palpable  Posterior  Tibial Palpable Palpable   Gastrointestinal: soft, non-distended. No guarding/no peritoneal signs.  Musculoskeletal: M/S 5/5 throughout.  No deformity or atrophy.  Neurologic: Pain and light touch intact in extremities.  Symmetrical.  Speech is fluent. Motor exam as listed above. Psychiatric: Patient's wife doing most of the communication.  History of vascular dementia. Dermatologic: No Venous rashes. No Ulcers Noted.  No changes consistent with cellulitis. Lymph : No Cervical lymphadenopathy, no lichenification or skin changes of chronic lymphedema.       ASSESSMENT AND PLAN:  1. Venous ulcer of ankle, left (HCC) Today we will place the patient in bilateral Unna wraps to control edema as well as to try to help with the lower extremity ulceration.  The patient currently does have a home health aide going to his house on a weekly basis.  We will attempt to have the patient get labs done by home health in order to minimize visits to the office.  If that is unable to be arranged we will have the patient continue to come to the office on a weekly basis to have the wraps changed.  Otherwise we will have the patient return in 4 weeks to evaluate the status of the wound as well as the lower extremity edema.  2. Essential (primary) hypertension Continue antihypertensive medications as already ordered, these medications have been reviewed and there are no changes at this time.   3. Hyperlipidemia, unspecified hyperlipidemia type Continue statin as ordered  and reviewed, no changes at this time    Current Outpatient Medications on File Prior to Visit  Medication Sig Dispense Refill  . apixaban (ELIQUIS) KIT FOLLOW INSTRUCTIONS (Patient taking differently: 5 each. FOLLOW INSTRUCTIONS) 1 kit 0  . atorvastatin (LIPITOR) 40 MG tablet Take 40 mg by mouth daily.    . Calcium Carbonate-Vit D-Min (CALTRATE 600+D PLUS MINERALS) 600-800 MG-UNIT TABS Take by mouth.    . docusate sodium (COLACE) 100 MG capsule Take 1 tablet once or twice daily as needed for constipation while taking narcotic pain medicine 30 capsule 0  . FLUoxetine (PROZAC) 40 MG capsule Take 1 capsule (40 mg total) by mouth daily. 30 capsule 3  . ibuprofen (ADVIL,MOTRIN) 800 MG tablet     . lisinopril (PRINIVIL,ZESTRIL) 10 MG tablet Take 10 mg by mouth daily.    . Multiple Vitamins-Minerals (MULTIVITAMIN & MINERAL PO) Take by mouth.    . potassium chloride SA (K-DUR,KLOR-CON) 20 MEQ tablet Take 20 mEq by mouth daily.    Marland Kitchen spironolactone (ALDACTONE) 25 MG tablet Take 1 tablet (25 mg total) by mouth daily. 30 tablet 2  . tamsulosin (FLOMAX) 0.4 MG CAPS Take 0.8 mg by mouth every morning.     Marland Kitchen amLODipine (NORVASC) 5 MG tablet Take 1 tablet (5 mg total) by mouth daily. (Patient not taking: Reported on 01/11/2018) 30 tablet 2  . Cholecalciferol 25 MCG (1000 UT) capsule Take by mouth.    . donepezil (ARICEPT) 10 MG tablet Take 10 mg by mouth daily.    Marland Kitchen doxazosin (CARDURA) 4 MG tablet doxazosin 4 mg tablet    . ELIQUIS STARTER PACK (ELIQUIS STARTER PACK) 5 MG TABS Take as directed on package: start with two-57m tablets twice daily for 7 days. On day 8, switch to one-547mtablet twice daily. (Patient not taking: Reported on 04/29/2019) 1 each 0  . finasteride (PROSCAR) 5 MG tablet finasteride 5 mg tablet    . fluticasone (FLONASE) 50 MCG/ACT nasal spray     . lisinopril-hydrochlorothiazide (PRINZIDE,ZESTORETIC) 20-12.5 MG  tablet     . lovastatin (MEVACOR) 20 MG tablet     .  oxyCODONE-acetaminophen (PERCOCET) 5-325 MG tablet Take 1-2 tablets by mouth every 6 (six) hours as needed for severe pain. (Patient not taking: Reported on 01/11/2018) 30 tablet 0  . prednisoLONE acetate (PRED FORTE) 1 % ophthalmic suspension   0  . sertraline (ZOLOFT) 100 MG tablet      No current facility-administered medications on file prior to visit.    There are no Patient Instructions on file for this visit. No follow-ups on file.   Kris Hartmann, NP  This note was completed with Sales executive.  Any errors are purely unintentional.

## 2019-05-27 ENCOUNTER — Ambulatory Visit (INDEPENDENT_AMBULATORY_CARE_PROVIDER_SITE_OTHER): Payer: Medicare (Managed Care) | Admitting: Nurse Practitioner

## 2019-05-31 ENCOUNTER — Other Ambulatory Visit: Payer: Self-pay

## 2019-05-31 ENCOUNTER — Encounter (INDEPENDENT_AMBULATORY_CARE_PROVIDER_SITE_OTHER): Payer: Self-pay | Admitting: Nurse Practitioner

## 2019-05-31 ENCOUNTER — Encounter (INDEPENDENT_AMBULATORY_CARE_PROVIDER_SITE_OTHER): Payer: Self-pay

## 2019-05-31 ENCOUNTER — Ambulatory Visit (INDEPENDENT_AMBULATORY_CARE_PROVIDER_SITE_OTHER): Payer: Medicare (Managed Care) | Admitting: Nurse Practitioner

## 2019-05-31 VITALS — BP 146/86 | HR 54 | Resp 14 | Ht 68.0 in | Wt 179.0 lb

## 2019-05-31 DIAGNOSIS — I10 Essential (primary) hypertension: Secondary | ICD-10-CM

## 2019-05-31 DIAGNOSIS — I890 Lymphedema, not elsewhere classified: Secondary | ICD-10-CM

## 2019-05-31 DIAGNOSIS — E785 Hyperlipidemia, unspecified: Secondary | ICD-10-CM

## 2019-05-31 NOTE — Progress Notes (Signed)
SUBJECTIVE:  Patient ID: Brett Lam, male    DOB: 06-29-1945, 74 y.o.   MRN: [id number] Chief Complaint  Patient presents with  . Follow-up    4 week unna wrap check     HPI  Brett Lam is a 74 y.o. male that returns today for evaluation of his lower extremity edema.  The patient had his left lower extremity wrapped due to swelling which caused the blister to open rupture and it was very slow to heal.  Today, the area that was blistered and opened has closed over however the patient still has significant edema and now it is bilaterally.  The patient does state that his Unna wraps were removed earlier by his health agency due to being too tight.  He denies any fever, chills, nausea, vomiting or diarrhea.  He elevates his lower extremities as much as possible.  He exercises when possible.  Past Medical History:  Diagnosis Date  . Anxiety   . Dizzy spells   . Elephantiasis   . Elevated PSA   . Enlarged prostate   . Genital herpes   . GERD (gastroesophageal reflux disease)    better since wt loss  . H/O flexible sigmoidoscopy 2002   rectal bleeding   . Hyperlipidemia   . Hypertension   . Osteoarthritis    better since wt loss  . Patient is Jehovah's Witness   . Rectal bleed   . Sleep apnea    cpap - does not need since wt loss  . Urinary frequency   . Venous stasis   . Wears dentures    partial upper and lower    Past Surgical History:  Procedure Laterality Date  . COLONOSCOPY    . COLONOSCOPY WITH PROPOFOL N/A 07/23/2015   Procedure: COLONOSCOPY WITH PROPOFOL;  Surgeon: Lucilla Lame, MD;  Location: [facility];  Service: Endoscopy;  Laterality: N/A;  . LAPAROSCOPIC GASTRIC RESTRICTIVE DUODENAL PROCEDURE (DUODENAL SWITCH) N/A 02/06/2015   Procedure: LAPAROSCOPIC GASTRIC RESTRICTIVE DUODENAL PROCEDURE (DUODENAL SWITCH);  Surgeon: Bonner Puna, MD;  Location: [facility];  Service: General;  Laterality: N/A;  . LIPOMA EXCISION  2004  . VEIN SURGERY Bilateral    legs    Social History   Socioeconomic History  . Marital status: Married    Spouse name: Not on file  . Number of children: Not on file  . Years of education: Not on file  . Highest education level: Not on file  Occupational History  . Not on file  Tobacco Use  . Smoking status: Former Smoker    Packs/day: 1.00    Years: 6.00    Pack years: 6.00    Types: Cigarettes    Quit date: 01/26/1971    Years since quitting: 48.3  . Smokeless tobacco: Never Used  Substance and Sexual Activity  . Alcohol use: No  . Drug use: No  . Sexual activity: Not on file  Other Topics Concern  . Not on file  Social History Narrative  . Not on file   Social Determinants of Health   Financial Resource Strain:   . Difficulty of Paying Living Expenses: Not on file  Food Insecurity:   . Worried About Charity fundraiser in the Last Year: Not on file  . Ran Out of Food in the Last Year: Not on file  Transportation Needs:   . Lack of Transportation (Medical): Not on file  . Lack of Transportation (Non-Medical): Not on file  Physical Activity:   .  Days of Exercise per Week: Not on file  . Minutes of Exercise per Session: Not on file  Stress:   . Feeling of Stress : Not on file  Social Connections:   . Frequency of Communication with Friends and Family: Not on file  . Frequency of Social Gatherings with Friends and Family: Not on file  . Attends Religious Services: Not on file  . Active Member of Clubs or Organizations: Not on file  . Attends Archivist Meetings: Not on file  . Marital Status: Not on file  Intimate Partner Violence:   . Fear of Current or Ex-Partner: Not on file  . Emotionally Abused: Not on file  . Physically Abused: Not on file  . Sexually Abused: Not on file    Family History  Problem Relation Age of Onset  . Hypertension Other     Allergies  Allergen Reactions  . Chlorpheniramine      Review of Systems   Review of Systems: Negative Unless  Checked Constitutional: '[]' Weight loss  '[]' Fever  '[]' Chills Cardiac: '[]' Chest pain   '[]'  Atrial Fibrillation  '[]' Palpitations   '[]' Shortness of breath when laying flat   '[]' Shortness of breath with exertion. '[]' Shortness of breath at rest Vascular:  '[]' Pain in legs with walking   '[]' Pain in legs with standing '[]' Pain in legs when laying flat   '[]' Claudication    '[]' Pain in feet when laying flat    '[x]' History of DVT   '[]' Phlebitis   '[x]' Swelling in legs   '[]' Varicose veins   '[]' Non-healing ulcers Pulmonary:   '[]' Uses home oxygen   '[]' Productive cough   '[]' Hemoptysis   '[]' Wheeze  '[]' COPD   '[]' Asthma Neurologic:  '[]' Dizziness   '[]' Seizures  '[]' Blackouts '[]' History of stroke   '[]' History of TIA  '[]' Aphasia   '[]' Temporary Blindness   '[]' Weakness or numbness in arm   '[]' Weakness or numbness in leg Musculoskeletal:   '[]' Joint swelling   '[]' Joint pain   '[]' Low back pain  '[]'  History of Knee Replacement '[x]' Arthritis '[]' back Surgeries  '[]'  Spinal Stenosis    Hematologic:  '[]' Easy bruising  '[]' Easy bleeding   '[]' Hypercoagulable state   '[]' Anemic Gastrointestinal:  '[]' Diarrhea   '[]' Vomiting  '[]' Gastroesophageal reflux/heartburn   '[]' Difficulty swallowing. '[]' Abdominal pain Genitourinary:  '[]' Chronic kidney disease   '[]' Difficult urination  '[]' Anuric   '[]' Blood in urine '[]' Frequent urination  '[]' Burning with urination   '[]' Hematuria Skin:  '[x]' Rashes   '[]' Ulcers '[]' Wounds Psychological:  '[]' History of anxiety   '[]'  History of major depression  '[x]'  Memory Difficulties      OBJECTIVE:   Physical Exam  BP (!) 146/86 (BP Location: Right Arm)   Pulse (!) 54   Resp 14   Ht '5\' 8"'  (1.727 m)   Wt 179 lb (81.2 kg)   BMI 27.22 kg/m   Gen: WD/WN, NAD Head: [place]/AT, No temporalis wasting.  Ear/Nose/Throat: Hearing grossly intact, nares w/o erythema or drainage Eyes: PER, EOMI, sclera nonicteric.  Neck: Supple, no masses.  No JVD.  Pulmonary:  Good air movement, no use of accessory muscles.  Cardiac: RRR Vascular:  3+ edema bilaterally Vessel Right Left  Radial  Palpable Palpable   Gastrointestinal: soft, non-distended. No guarding/no peritoneal signs.  Musculoskeletal: M/S 5/5 throughout.  No deformity or atrophy.  Neurologic: Pain and light touch intact in extremities.  Symmetrical.  Speech is fluent. Motor exam as listed above. Psychiatric: Judgment intact, Mood & affect appropriate for pt's clinical situation. Dermatologic:  Stasis dermatitis bilaterally.. No Ulcers Noted.  No changes consistent with cellulitis. Lymph :  No Cervical lymphadenopathy, dermal thickening bilaterally       ASSESSMENT AND PLAN:  1. Lymphedema Recommend:  We will place the patient back into bilateral Unna wraps to give better control of the patient's edema.  These will be changed weekly by his home health agency.  The patient is again reminded that these are to be left on and not removed on a daily basis.  Also that these are not to be wet.  I have reviewed my previous discussion with the patient regarding swelling and why it causes symptoms.  Patient will continue wearing graduated compression stockings class 1 (20-30 mmHg) on a daily basis once he is out of Unna wraps. The patient will  beginning wearing the stockings first thing in the morning and removing them in the evening. The patient is instructed specifically not to sleep in the stockings.    In addition, behavioral modification including several periods of elevation of the lower extremities during the day will be continued.  This was reviewed with the patient during the initial visit.  The patient will also continue routine exercise, especially walking on a daily basis as was discussed during the initial visit.    Despite conservative treatments for at least 4 weeks including graduated compression therapy class 1 and behavioral modification including exercise and elevation the patient  has not obtained adequate control of the lymphedema.  The patient still has stage 3 lymphedema and therefore, I believe that a  lymph pump should be added to improve the control of the patient's lymphedema.  Additionally, a lymph pump is warranted because it will reduce the risk of cellulitis and ulceration in the future.  Patient should follow up in 4 weeks to assess swelling  - VAS [place] LOWER EXTREMITY VENOUS REFLUX; Future  2. Hyperlipidemia, unspecified hyperlipidemia type Continue statin as ordered and reviewed, no changes at this time   3. Essential hypertension Continue antihypertensive medications as already ordered, these medications have been reviewed and there are no changes at this time.    Current Outpatient Medications on File Prior to Visit  Medication Sig Dispense Refill  . amLODipine (NORVASC) 5 MG tablet Take 1 tablet (5 mg total) by mouth daily. 30 tablet 2  . apixaban (ELIQUIS) KIT FOLLOW INSTRUCTIONS (Patient taking differently: 5 each. FOLLOW INSTRUCTIONS) 1 kit 0  . atorvastatin (LIPITOR) 40 MG tablet Take 40 mg by mouth daily.    . Calcium Carbonate-Vit D-Min (CALTRATE 600+D PLUS MINERALS) 600-800 MG-UNIT TABS Take by mouth.    . Cholecalciferol 25 MCG (1000 UT) capsule Take by mouth.    . docusate sodium (COLACE) 100 MG capsule Take 1 tablet once or twice daily as needed for constipation while taking narcotic pain medicine 30 capsule 0  . donepezil (ARICEPT) 10 MG tablet Take 10 mg by mouth daily.    Marland Kitchen doxazosin (CARDURA) 4 MG tablet doxazosin 4 mg tablet    . ELIQUIS STARTER PACK (ELIQUIS STARTER PACK) 5 MG TABS Take as directed on package: start with two-26m tablets twice daily for 7 days. On day 8, switch to one-527mtablet twice daily. 1 each 0  . finasteride (PROSCAR) 5 MG tablet finasteride 5 mg tablet    . FLUoxetine (PROZAC) 40 MG capsule Take 1 capsule (40 mg total) by mouth daily. 30 capsule 3  . fluticasone (FLONASE) 50 MCG/ACT nasal spray     . ibuprofen (ADVIL,MOTRIN) 800 MG tablet     . lisinopril (PRINIVIL,ZESTRIL) 10 MG tablet Take 10 mg by mouth  daily.    .  lisinopril-hydrochlorothiazide (PRINZIDE,ZESTORETIC) 20-12.5 MG tablet     . lovastatin (MEVACOR) 20 MG tablet     . Multiple Vitamins-Minerals (MULTIVITAMIN & MINERAL PO) Take by mouth.    . oxyCODONE-acetaminophen (PERCOCET) 5-325 MG tablet Take 1-2 tablets by mouth every 6 (six) hours as needed for severe pain. 30 tablet 0  . potassium chloride SA (K-DUR,KLOR-CON) 20 MEQ tablet Take 20 mEq by mouth daily.    . prednisoLONE acetate (PRED FORTE) 1 % ophthalmic suspension   0  . sertraline (ZOLOFT) 100 MG tablet     . spironolactone (ALDACTONE) 25 MG tablet Take 1 tablet (25 mg total) by mouth daily. 30 tablet 2  . tamsulosin (FLOMAX) 0.4 MG CAPS Take 0.8 mg by mouth every morning.      No current facility-administered medications on file prior to visit.    There are no Patient Instructions on file for this visit. No follow-ups on file.   Kris Hartmann, NP  This note was completed with Sales executive.  Any errors are purely unintentional.

## 2019-06-28 ENCOUNTER — Encounter (INDEPENDENT_AMBULATORY_CARE_PROVIDER_SITE_OTHER): Payer: Medicare (Managed Care)

## 2019-06-28 ENCOUNTER — Ambulatory Visit (INDEPENDENT_AMBULATORY_CARE_PROVIDER_SITE_OTHER): Payer: Medicare (Managed Care) | Admitting: Nurse Practitioner

## 2019-10-28 ENCOUNTER — Other Ambulatory Visit: Payer: Self-pay

## 2019-10-28 ENCOUNTER — Ambulatory Visit
Admission: RE | Admit: 2019-10-28 | Discharge: 2019-10-28 | Disposition: A | Discharge status: Home / Self Care | Payer: Medicare (Managed Care) | Source: Ambulatory Visit | Attending: Adult Health | Admitting: Adult Health

## 2019-10-28 ENCOUNTER — Other Ambulatory Visit: Payer: Self-pay | Admitting: Adult Health

## 2019-10-28 DIAGNOSIS — R296 Repeated falls: Secondary | ICD-10-CM

## 2019-10-28 DIAGNOSIS — Y92009 Unspecified place in unspecified non-institutional (private) residence as the place of occurrence of the external cause: Secondary | ICD-10-CM | POA: Insufficient documentation

## 2019-10-28 DIAGNOSIS — W19XXXA Unspecified fall, initial encounter: Secondary | ICD-10-CM | POA: Diagnosis not present

## 2019-10-28 DIAGNOSIS — M25521 Pain in right elbow: Secondary | ICD-10-CM | POA: Insufficient documentation

## 2019-12-27 IMAGING — DX DG HUMERUS 2V *L*
4 series · 4 of 4 positions shown · non-contrast
Comparison: Left shoulder radiograph dated 09/08/2017

CLINICAL DATA: 72-year-old male with recent fall and humeral
fracture. Repeated fall on the left shoulder.

EXAM:
LEFT HUMERUS - 2+ VIEW

[humerus ap]
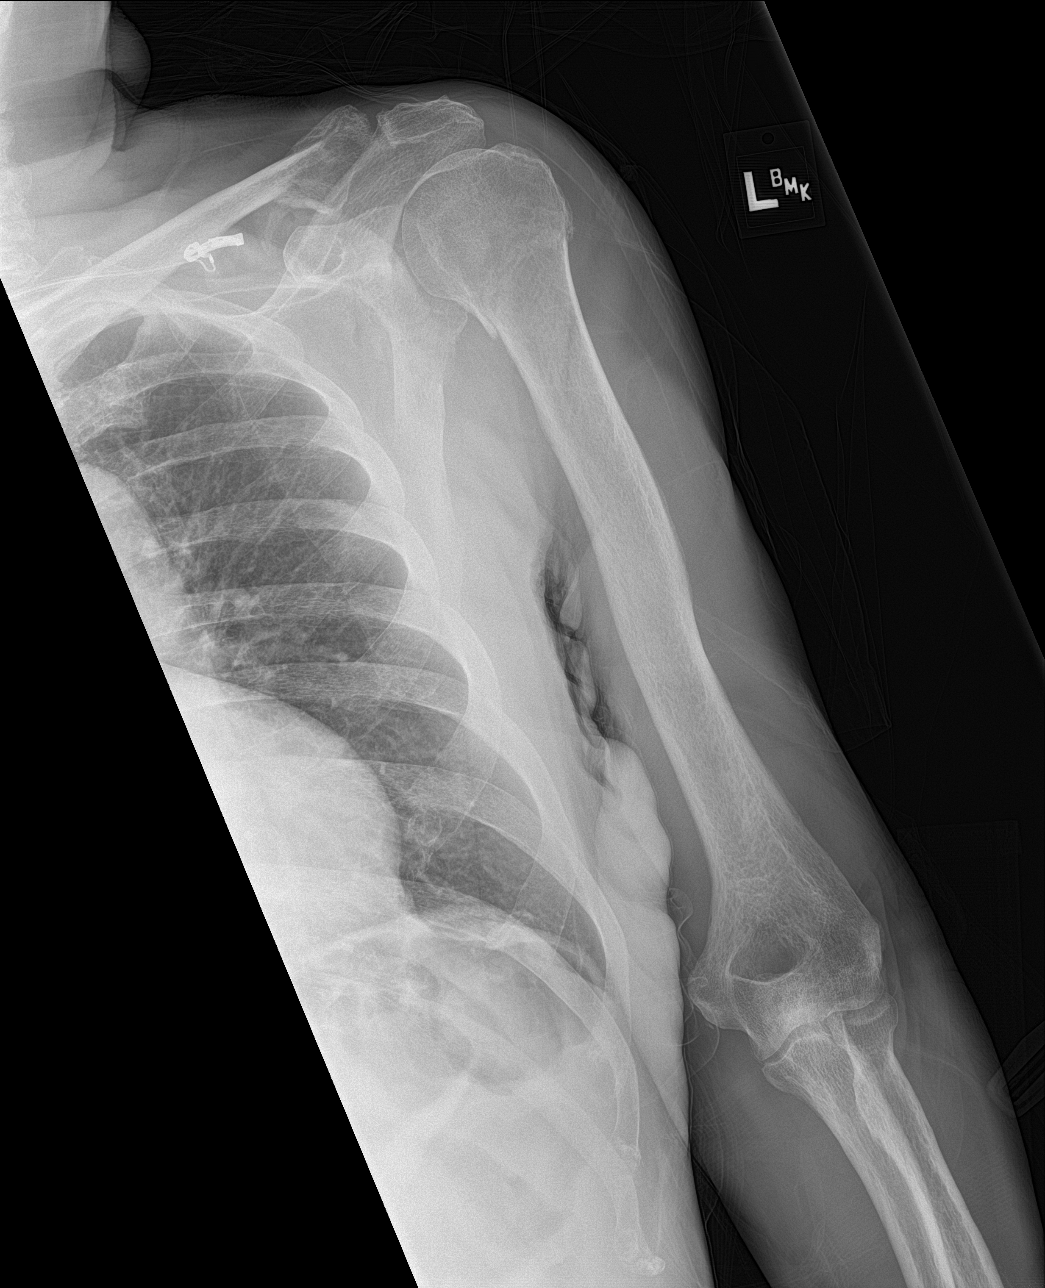

[humerus lat (1 of 3)]
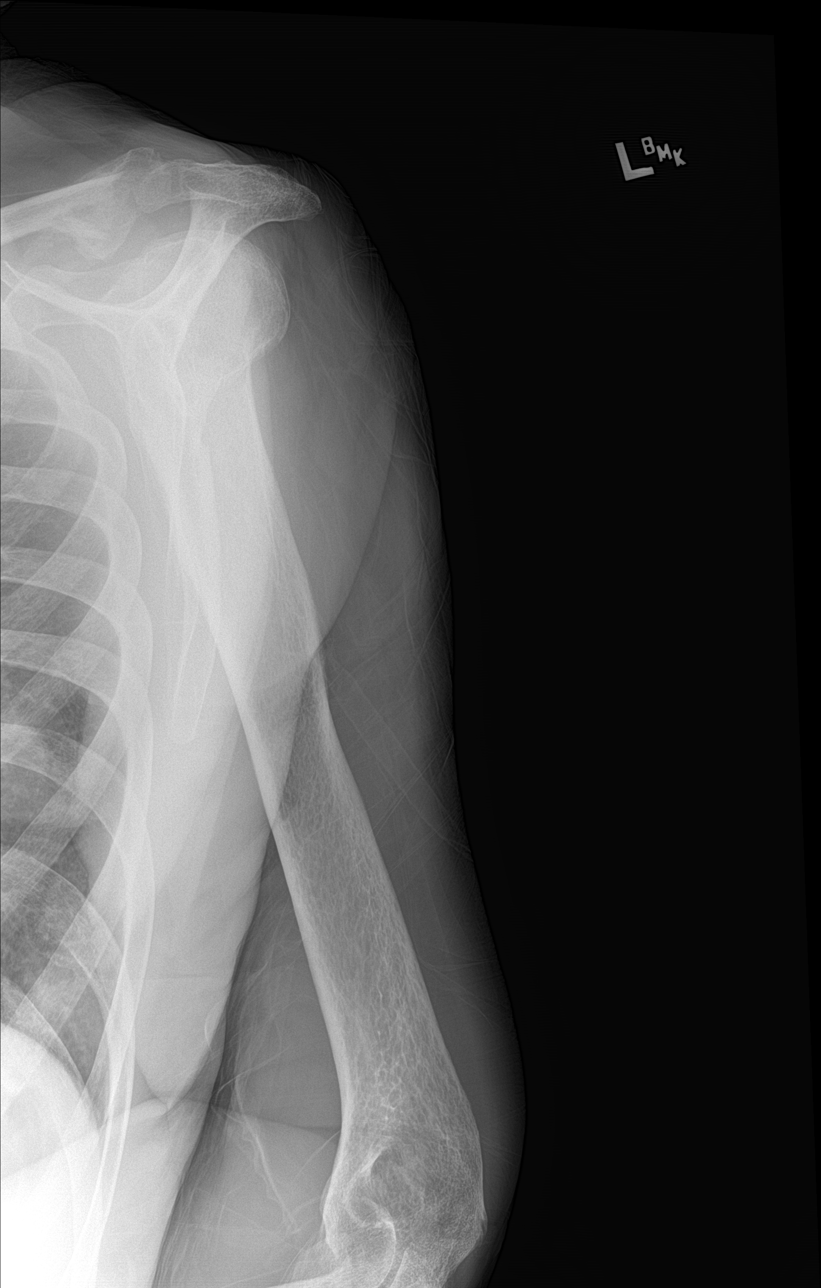

[humerus lat (2 of 3)]
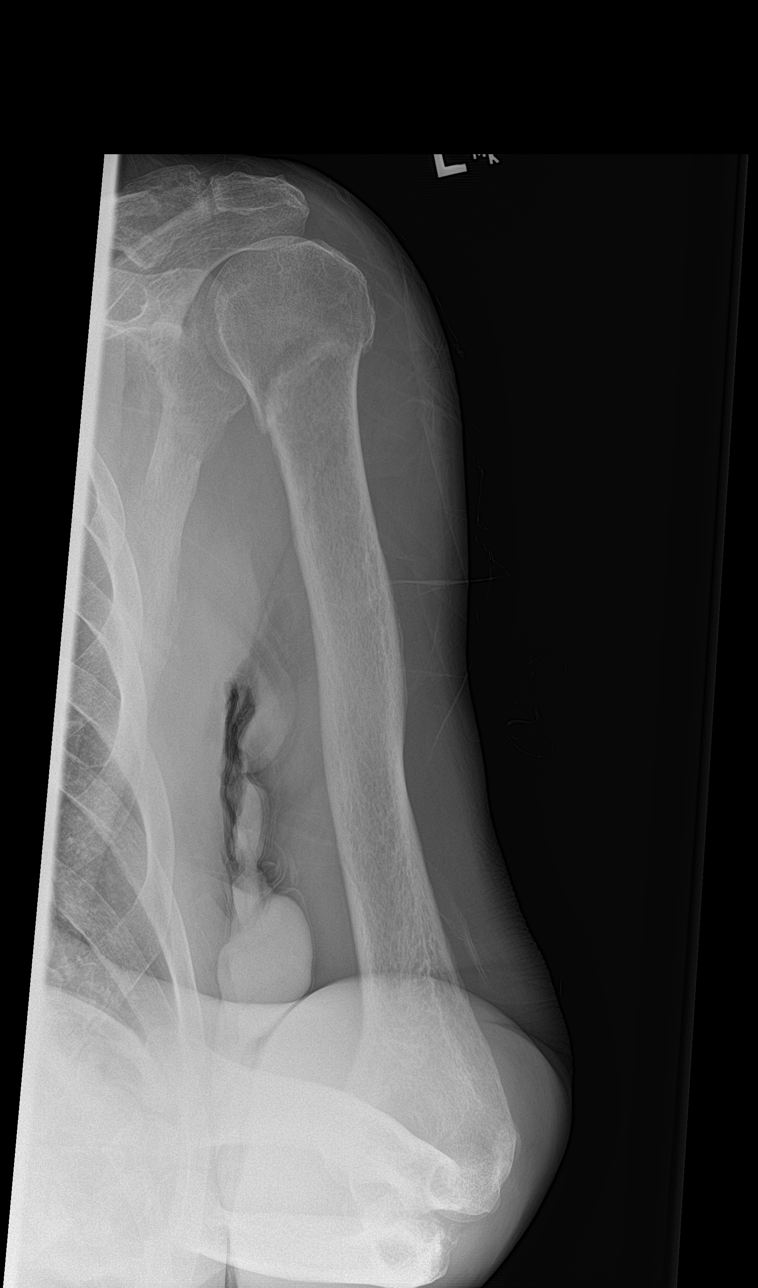

[humerus lat (3 of 3)]
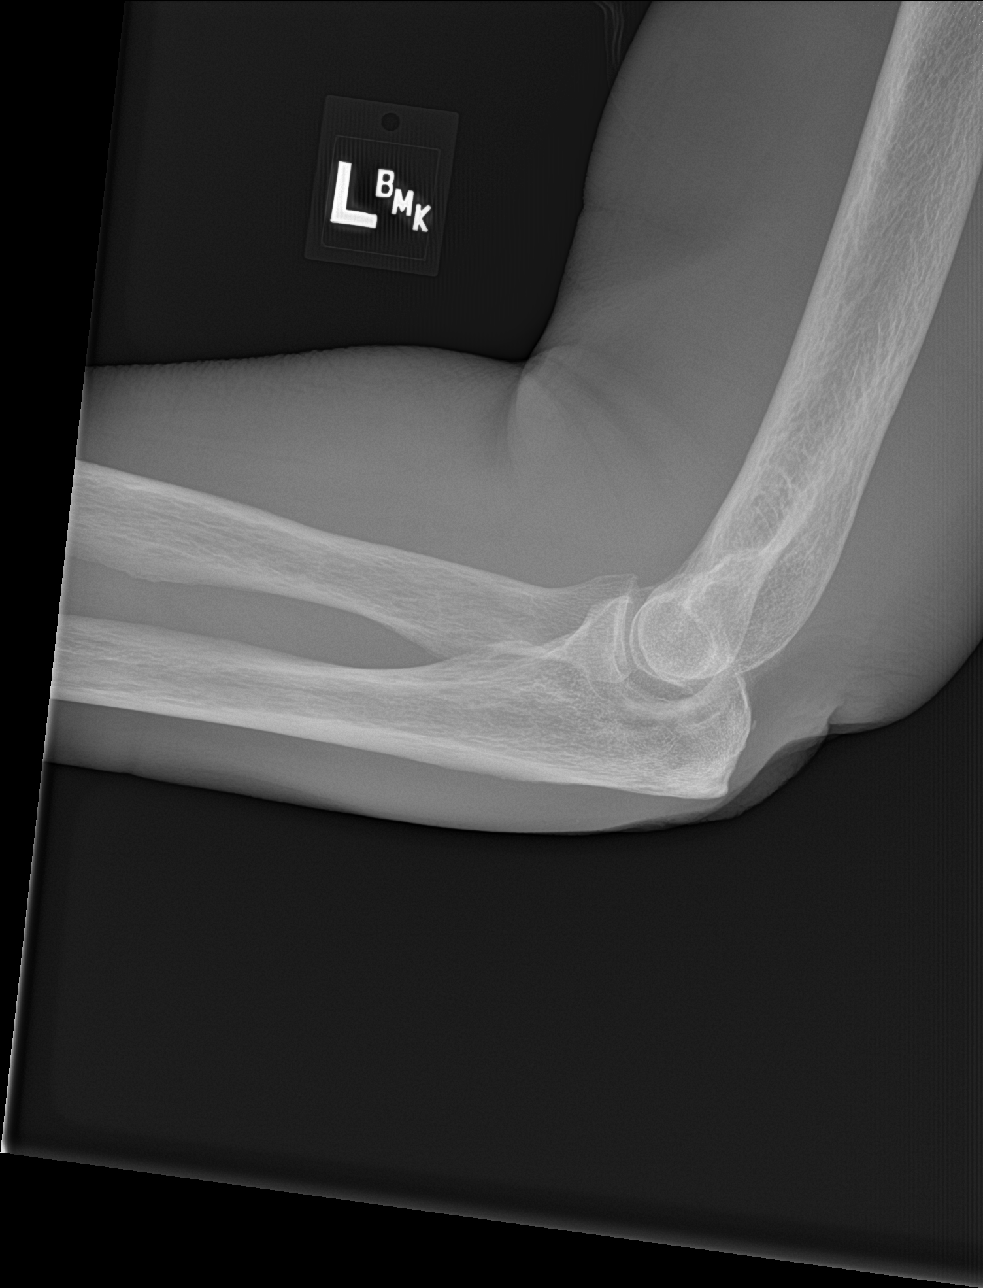

[4 of 4 positions shown; findings below may reference images not displayed]

FINDINGS: There is sclerotic changes of the margins of the previously seen
left humeral neck fracture with partial healing. Old fracture of the
lateral aspect of the left clavicle is also noted. No acute or new
fracture identified. The bones are osteopenic. There is no
dislocation. The soft tissues appear unremarkable.
IMPRESSION: 1. No acute/new fractures.
2. Partially healed left humeral neck fracture as well as old
clavicular fracture.

## 2020-01-16 ENCOUNTER — Other Ambulatory Visit: Payer: Self-pay | Admitting: Adult Health

## 2020-01-16 DIAGNOSIS — R634 Abnormal weight loss: Secondary | ICD-10-CM

## 2020-01-18 ENCOUNTER — Other Ambulatory Visit (HOSPITAL_COMMUNITY): Payer: Self-pay | Admitting: Adult Health

## 2020-01-18 ENCOUNTER — Other Ambulatory Visit: Payer: Self-pay

## 2020-01-18 ENCOUNTER — Other Ambulatory Visit: Payer: Self-pay | Admitting: Adult Health

## 2020-01-18 ENCOUNTER — Ambulatory Visit
Admission: RE | Admit: 2020-01-18 | Discharge: 2020-01-18 | Disposition: A | Discharge status: Home / Self Care | Payer: Medicare (Managed Care) | Source: Ambulatory Visit | Attending: Adult Health | Admitting: Adult Health

## 2020-01-18 DIAGNOSIS — R634 Abnormal weight loss: Secondary | ICD-10-CM

## 2020-01-19 ENCOUNTER — Ambulatory Visit
Admission: RE | Admit: 2020-01-19 | Discharge: 2020-01-19 | Disposition: A | Discharge status: Home / Self Care | Payer: Medicare (Managed Care) | Source: Home / Self Care | Attending: *Deleted | Admitting: *Deleted

## 2020-01-19 ENCOUNTER — Other Ambulatory Visit: Payer: Self-pay | Admitting: Adult Health

## 2020-01-19 ENCOUNTER — Ambulatory Visit
Admission: RE | Admit: 2020-01-19 | Discharge: 2020-01-19 | Disposition: A | Discharge status: Home / Self Care | Payer: Medicare (Managed Care) | Source: Ambulatory Visit | Attending: Adult Health | Admitting: Adult Health

## 2020-01-19 DIAGNOSIS — R296 Repeated falls: Secondary | ICD-10-CM

## 2020-01-24 ENCOUNTER — Ambulatory Visit
Admission: RE | Admit: 2020-01-24 | Discharge: 2020-01-24 | Disposition: A | Discharge status: Home / Self Care | Payer: Medicare (Managed Care) | Source: Ambulatory Visit | Attending: Adult Health | Admitting: Adult Health

## 2020-01-24 ENCOUNTER — Other Ambulatory Visit: Payer: Self-pay

## 2020-01-24 DIAGNOSIS — R634 Abnormal weight loss: Secondary | ICD-10-CM | POA: Insufficient documentation

## 2020-01-24 MED ORDER — IOHEXOL 300 MG/ML  SOLN
100.0000 mL | Freq: Once | INTRAMUSCULAR | Status: AC | PRN
  Administered 2020-01-24: 80 mL via INTRAVENOUS

## 2020-03-16 ENCOUNTER — Other Ambulatory Visit: Payer: Self-pay | Admitting: Adult Health

## 2020-03-16 DIAGNOSIS — Z9884 Bariatric surgery status: Secondary | ICD-10-CM

## 2020-03-20 ENCOUNTER — Ambulatory Visit: Payer: Medicare (Managed Care)

## 2020-03-23 DIAGNOSIS — U071 COVID-19: Secondary | ICD-10-CM

## 2020-03-23 HISTORY — DX: COVID-19: U07.1

## 2020-03-25 ENCOUNTER — Emergency Department: Payer: Medicare (Managed Care)

## 2020-03-25 ENCOUNTER — Encounter: Payer: Self-pay | Admitting: *Deleted

## 2020-03-25 ENCOUNTER — Other Ambulatory Visit: Payer: Self-pay

## 2020-03-25 ENCOUNTER — Inpatient Hospital Stay
Admission: EM | Admit: 2020-03-25 | Discharge: 2020-04-02 | DRG: 177 | Disposition: A | Discharge status: Skilled Nursing Facility | Payer: Medicare (Managed Care) | Attending: Hospitalist | Admitting: Hospitalist

## 2020-03-25 DIAGNOSIS — N400 Enlarged prostate without lower urinary tract symptoms: Secondary | ICD-10-CM | POA: Diagnosis present

## 2020-03-25 DIAGNOSIS — D6959 Other secondary thrombocytopenia: Secondary | ICD-10-CM | POA: Diagnosis present

## 2020-03-25 DIAGNOSIS — J1282 Pneumonia due to coronavirus disease 2019: Secondary | ICD-10-CM | POA: Diagnosis present

## 2020-03-25 DIAGNOSIS — E43 Unspecified severe protein-calorie malnutrition: Secondary | ICD-10-CM | POA: Insufficient documentation

## 2020-03-25 DIAGNOSIS — R5381 Other malaise: Secondary | ICD-10-CM | POA: Diagnosis present

## 2020-03-25 DIAGNOSIS — Z87891 Personal history of nicotine dependence: Secondary | ICD-10-CM | POA: Diagnosis not present

## 2020-03-25 DIAGNOSIS — E876 Hypokalemia: Secondary | ICD-10-CM | POA: Diagnosis present

## 2020-03-25 DIAGNOSIS — I4510 Unspecified right bundle-branch block: Secondary | ICD-10-CM | POA: Diagnosis present

## 2020-03-25 DIAGNOSIS — E860 Dehydration: Secondary | ICD-10-CM | POA: Diagnosis present

## 2020-03-25 DIAGNOSIS — G4733 Obstructive sleep apnea (adult) (pediatric): Secondary | ICD-10-CM | POA: Diagnosis present

## 2020-03-25 DIAGNOSIS — E871 Hypo-osmolality and hyponatremia: Secondary | ICD-10-CM | POA: Diagnosis present

## 2020-03-25 DIAGNOSIS — Z86718 Personal history of other venous thrombosis and embolism: Secondary | ICD-10-CM

## 2020-03-25 DIAGNOSIS — Z79899 Other long term (current) drug therapy: Secondary | ICD-10-CM | POA: Diagnosis not present

## 2020-03-25 DIAGNOSIS — F419 Anxiety disorder, unspecified: Secondary | ICD-10-CM | POA: Diagnosis present

## 2020-03-25 DIAGNOSIS — I10 Essential (primary) hypertension: Secondary | ICD-10-CM | POA: Diagnosis present

## 2020-03-25 DIAGNOSIS — I129 Hypertensive chronic kidney disease with stage 1 through stage 4 chronic kidney disease, or unspecified chronic kidney disease: Secondary | ICD-10-CM | POA: Diagnosis present

## 2020-03-25 DIAGNOSIS — N179 Acute kidney failure, unspecified: Principal | ICD-10-CM | POA: Diagnosis present

## 2020-03-25 DIAGNOSIS — E785 Hyperlipidemia, unspecified: Secondary | ICD-10-CM | POA: Diagnosis present

## 2020-03-25 DIAGNOSIS — Z9884 Bariatric surgery status: Secondary | ICD-10-CM

## 2020-03-25 DIAGNOSIS — Z515 Encounter for palliative care: Secondary | ICD-10-CM | POA: Diagnosis not present

## 2020-03-25 DIAGNOSIS — I959 Hypotension, unspecified: Secondary | ICD-10-CM | POA: Diagnosis present

## 2020-03-25 DIAGNOSIS — Z7189 Other specified counseling: Secondary | ICD-10-CM | POA: Diagnosis not present

## 2020-03-25 DIAGNOSIS — Z7952 Long term (current) use of systemic steroids: Secondary | ICD-10-CM | POA: Diagnosis not present

## 2020-03-25 DIAGNOSIS — E872 Acidosis: Secondary | ICD-10-CM | POA: Diagnosis present

## 2020-03-25 DIAGNOSIS — E162 Hypoglycemia, unspecified: Secondary | ICD-10-CM | POA: Diagnosis not present

## 2020-03-25 DIAGNOSIS — F0150 Vascular dementia without behavioral disturbance: Secondary | ICD-10-CM | POA: Diagnosis present

## 2020-03-25 DIAGNOSIS — I82A12 Acute embolism and thrombosis of left axillary vein: Secondary | ICD-10-CM | POA: Diagnosis not present

## 2020-03-25 DIAGNOSIS — L8990 Pressure ulcer of unspecified site, unspecified stage: Secondary | ICD-10-CM | POA: Insufficient documentation

## 2020-03-25 DIAGNOSIS — G4730 Sleep apnea, unspecified: Secondary | ICD-10-CM | POA: Diagnosis present

## 2020-03-25 DIAGNOSIS — N182 Chronic kidney disease, stage 2 (mild): Secondary | ICD-10-CM | POA: Diagnosis present

## 2020-03-25 DIAGNOSIS — R627 Adult failure to thrive: Secondary | ICD-10-CM

## 2020-03-25 DIAGNOSIS — U071 COVID-19: Principal | ICD-10-CM | POA: Diagnosis present

## 2020-03-25 DIAGNOSIS — Z66 Do not resuscitate: Secondary | ICD-10-CM | POA: Diagnosis present

## 2020-03-25 DIAGNOSIS — D529 Folate deficiency anemia, unspecified: Secondary | ICD-10-CM | POA: Diagnosis present

## 2020-03-25 DIAGNOSIS — Z681 Body mass index (BMI) 19 or less, adult: Secondary | ICD-10-CM

## 2020-03-25 DIAGNOSIS — R638 Other symptoms and signs concerning food and fluid intake: Secondary | ICD-10-CM

## 2020-03-25 DIAGNOSIS — R531 Weakness: Secondary | ICD-10-CM | POA: Diagnosis present

## 2020-03-25 DIAGNOSIS — Z23 Encounter for immunization: Secondary | ICD-10-CM

## 2020-03-25 DIAGNOSIS — K219 Gastro-esophageal reflux disease without esophagitis: Secondary | ICD-10-CM | POA: Diagnosis present

## 2020-03-25 DIAGNOSIS — Z7901 Long term (current) use of anticoagulants: Secondary | ICD-10-CM | POA: Diagnosis not present

## 2020-03-25 LAB — URINALYSIS, COMPLETE (UACMP) WITH MICROSCOPIC
Bacteria, UA: NONE SEEN
Bilirubin Urine: NEGATIVE
Glucose, UA: NEGATIVE mg/dL
Hgb urine dipstick: NEGATIVE
Ketones, ur: NEGATIVE mg/dL
Leukocytes,Ua: NEGATIVE
Nitrite: NEGATIVE
Protein, ur: NEGATIVE mg/dL
Specific Gravity, Urine: 1.011 (ref 1.005–1.030)
pH: 5 (ref 5.0–8.0)

## 2020-03-25 LAB — CBC
HCT: 27.4 % — ABNORMAL LOW (ref 39.0–52.0)
Hemoglobin: 9.3 g/dL — ABNORMAL LOW (ref 13.0–17.0)
MCH: 33.8 pg (ref 26.0–34.0)
MCHC: 33.9 g/dL (ref 30.0–36.0)
MCV: 99.6 fL (ref 80.0–100.0)
Platelets: 106 10*3/uL — ABNORMAL LOW (ref 150–400)
RBC: 2.75 MIL/uL — ABNORMAL LOW (ref 4.22–5.81)
RDW: 13.4 % (ref 11.5–15.5)
WBC: 5.7 10*3/uL (ref 4.0–10.5)
nRBC: 0 % (ref 0.0–0.2)

## 2020-03-25 LAB — BASIC METABOLIC PANEL
Anion gap: 10 (ref 5–15)
BUN: 38 mg/dL — ABNORMAL HIGH (ref 8–23)
CO2: 18 mmol/L — ABNORMAL LOW (ref 22–32)
Calcium: 7.4 mg/dL — ABNORMAL LOW (ref 8.9–10.3)
Chloride: 112 mmol/L — ABNORMAL HIGH (ref 98–111)
Creatinine, Ser: 1.98 mg/dL — ABNORMAL HIGH (ref 0.61–1.24)
GFR, Estimated: 35 mL/min — ABNORMAL LOW (ref >60)
Glucose, Bld: 94 mg/dL (ref 70–99)
Potassium: 2.7 mmol/L — CL (ref 3.5–5.1)
Sodium: 140 mmol/L (ref 135–145)

## 2020-03-25 MED ORDER — POTASSIUM CHLORIDE CRYS ER 20 MEQ PO TBCR: 20.0000 meq | EXTENDED_RELEASE_TABLET | Freq: Every day | ORAL | 0 refills | Status: DC

## 2020-03-25 MED ORDER — QUETIAPINE FUMARATE 25 MG PO TABS
25.0000 mg | ORAL_TABLET | Freq: Once | ORAL | Status: AC
  Administered 2020-03-25: 25 mg via ORAL
  Filled 2020-03-25: qty 1

## 2020-03-25 MED ORDER — POTASSIUM CHLORIDE CRYS ER 20 MEQ PO TBCR
30.0000 meq | EXTENDED_RELEASE_TABLET | Freq: Once | ORAL | Status: AC
  Administered 2020-03-25: 30 meq via ORAL
  Filled 2020-03-25: qty 2

## 2020-03-25 MED ORDER — TRAZODONE HCL 50 MG PO TABS
50.0000 mg | ORAL_TABLET | Freq: Once | ORAL | Status: AC
  Administered 2020-03-25: 50 mg via ORAL
  Filled 2020-03-25: qty 1

## 2020-03-25 MED ORDER — SODIUM CHLORIDE 0.9 % IV BOLUS
500.0000 mL | Freq: Once | INTRAVENOUS | Status: AC
  Administered 2020-03-25: 500 mL via INTRAVENOUS

## 2020-03-25 NOTE — ED Provider Notes (Signed)
-----------------------------------------   11:48 PM on 03/25/2020 -----------------------------------------  The patient was evaluated by Dr. Vicente Males and the plan was initially for him to go home if possible.  However the patient's PCP with the [facility], Dr. Lajuana Matte [phone]) call the discussed the case with me.  She knows him well and is very concerned because he is so weak at this point that he cannot get out of bed or care for himself.  This has been somewhat of an ongoing issue but has become acutely worse and he was diagnosed with COVID-19 as an outpatient a few days ago. (She is faxing the results.)  The [facility] program will work to try to find appropriate outpatient placement once he is appropriate for discharge but he is not safe to go home at this time.  Given his acute kidney injury and his critically low potassium, he should be appropriate for hospital care at least under observation status while appropriate outpatient placement is determined.  I discussed the case by phone with Dr. Mikeal Hawthorne with the hospitalist service and he will admit.    ----------------------------------------- 12:51 AM on 03/26/2020 -----------------------------------------  Dr. Purcell Mouton faxed a copy of the COVID+ result from 11/5:   She also faxed lab results indicating that the patient's creatinine has been steadily going up over time.  The most recent outpatient result was 1.6.  She also mentioned that the last weight they have on record was around 130 lbs, so if the weight tonight is accurate, this may represent a great increase in volume and the need for additional diuresis.  I will update Dr. Mikeal Hawthorne as well.   Loleta Rose, MD 03/26/20 [id number]

## 2020-03-25 NOTE — ED Notes (Signed)
This RN spoke with [facility] staff, advising wife should be able to allow EMS into home to bring patient home. [facility] staff given report.

## 2020-03-25 NOTE — ED Notes (Addendum)
This RN spoke with wife, wife refusing patient to come home stating that she does not want him at home because he is covid positive. This RN attempted to educate wife that patient has been covid positive since at least Friday and that she and anyone else who has been in the home with him has already been exposed and should quarantine and monitor for symptoms. Wife still refusing patient to come home, stating "I'm getting ready to go to bed, I can't deal with him tonight." Charge RN made aware and spoke with wife as well. Charge RN to speak with [facility] staff.

## 2020-03-25 NOTE — ED Triage Notes (Addendum)
Pt to ED via EMS reporting increased weakness and decreased PO intake x 2 days. Pt was dx with COVID today and reports he has had a cough but denies SOB or chest pain. Pt also reporting bilateral leg pains. Legs appear to have +1 pitting edema upon assessment but pt denies tenderness.   EMS vitals: 98/85 96% RA 147 CBG

## 2020-03-25 NOTE — H&P (Signed)
History and Physical   Brett Lam:[id number] DOB: [birth year] DOA: 03/25/2020  Referring MD/NP/PA: Dr. Karma Greaser  PCP: Leticia Penna, MD   Outpatient Specialists: [facility]  Patient coming from: Home  Chief Complaint: Generalized weakness  HPI: Brett Lam is a 74 y.o. male with medical history significant of obstructive sleep apnea, hypertension, hyperlipidemia, GERD, elephantiasis, BPH, hard of hearing and osteoarthritis who was diagnosed with COVID-19 about 5 days ago through the [facility] program.  Patient is a poor historian not able to give adequate history.  History is pieced to his pace program physician Dr. Ovid Curd and EMS report.  Patient is at home being cared for but has gradually gotten weak.  He is failing to thrive at home.  He was sent to the ER where he was found to have acute kidney injury with significant dehydration and hypokalemia potassium 2.7.  Patient has no respiratory symptoms.  He apparently has been immunized against Covid.  He has no cough no fever or chills.  Due to patient's debility is being admitted to the hospital for further evaluation and treatment.  The [facility] is currently planning home setting with rehab..  ED Course: Temperature 98.3 blood pressure 84/56 pulse 69 respirate 25 oxygen sat 95% on room air.  White count 5.7 hemoglobin 9.3 and platelets 106.  Sodium is 140 potassium 2.7 chloride 112 CO2 of 18 BUN 30 creatinine 1.9 and calcium 7.4.  Chest x-ray shows findings of atypical infection with trace bilateral pleural effusions.  Patient being admitted for further evaluation and treatment.  Review of Systems: As per HPI otherwise 10 point review of systems negative.    Past Medical History:  Diagnosis Date  . Anxiety   . Dizzy spells   . Elephantiasis   . Elevated PSA   . Enlarged prostate   . Genital herpes   . GERD (gastroesophageal reflux disease)    better since wt loss  . H/O flexible sigmoidoscopy 2002   rectal  bleeding   . Hyperlipidemia   . Hypertension   . Osteoarthritis    better since wt loss  . Patient is Jehovah's Witness   . Rectal bleed   . Sleep apnea    cpap - does not need since wt loss  . Urinary frequency   . Venous stasis   . Wears dentures    partial upper and lower    Past Surgical History:  Procedure Laterality Date  . COLONOSCOPY    . COLONOSCOPY WITH PROPOFOL N/A 07/23/2015   Procedure: COLONOSCOPY WITH PROPOFOL;  Surgeon: Lucilla Lame, MD;  Location: [facility];  Service: Endoscopy;  Laterality: N/A;  . LAPAROSCOPIC GASTRIC RESTRICTIVE DUODENAL PROCEDURE (DUODENAL SWITCH) N/A 02/06/2015   Procedure: LAPAROSCOPIC GASTRIC RESTRICTIVE DUODENAL PROCEDURE (DUODENAL SWITCH);  Surgeon: Bonner Puna, MD;  Location: [facility];  Service: General;  Laterality: N/A;  . LIPOMA EXCISION  2004  . VEIN SURGERY Bilateral    legs     reports that he quit smoking about 49 years ago. His smoking use included cigarettes. He has a 6.00 pack-year smoking history. He has never used smokeless tobacco. He reports that he does not drink alcohol and does not use drugs.  Allergies  Allergen Reactions  . Chlorpheniramine     Family History  Problem Relation Age of Onset  . Hypertension Other      Prior to Admission medications   Medication Sig Start Date End Date Taking? Authorizing Provider  amLODipine (NORVASC) 5 MG  tablet Take 1 tablet (5 mg total) by mouth daily. 09/21/17   Gladstone Lighter, MD  apixaban Arne Cleveland) KIT FOLLOW INSTRUCTIONS Patient taking differently: 5 each. FOLLOW INSTRUCTIONS 09/20/17   Gladstone Lighter, MD  atorvastatin (LIPITOR) 40 MG tablet Take 40 mg by mouth daily.    [provider]  Calcium Carbonate-Vit D-Min (CALTRATE 600+D PLUS MINERALS) 600-800 MG-UNIT TABS Take by mouth.    [provider]  Cholecalciferol 25 MCG (1000 UT) capsule Take by mouth.    [provider]  docusate sodium (COLACE) 100 MG capsule Take 1 tablet once  or twice daily as needed for constipation while taking narcotic pain medicine 09/08/17   Hinda Kehr, MD  donepezil (ARICEPT) 10 MG tablet Take 10 mg by mouth daily.    [provider]  doxazosin (CARDURA) 4 MG tablet doxazosin 4 mg tablet    [provider]  ELIQUIS STARTER PACK (ELIQUIS STARTER PACK) 5 MG TABS Take as directed on package: start with two-11m tablets twice daily for 7 days. On day 8, switch to one-555mtablet twice daily. 09/20/17   KaGladstone LighterMD  finasteride (PROSCAR) 5 MG tablet finasteride 5 mg tablet    [provider]  FLUoxetine (PROZAC) 40 MG capsule Take 1 capsule (40 mg total) by mouth daily. 06/05/17   KaGladstone LighterMD  fluticasone (FAsencion Islam50 MCG/ACT nasal spray  01/20/19   [provider]  ibuprofen (ADVIL,MOTRIN) 800 MG tablet  10/11/09   [provider]  lisinopril (PRINIVIL,ZESTRIL) 10 MG tablet Take 10 mg by mouth daily.    [provider]  lisinopril-hydrochlorothiazide (PReita May20-12.5 MG tablet  09/04/09   [provider]  lovastatin (MEVACOR) 20 MG tablet  09/05/09   [provider]  Multiple Vitamins-Minerals (MULTIVITAMIN & MINERAL PO) Take by mouth.    [provider]  oxyCODONE-acetaminophen (PERCOCET) 5-325 MG tablet Take 1-2 tablets by mouth every 6 (six) hours as needed for severe pain. 09/08/17   FoHinda KehrMD  potassium chloride SA (KLOR-CON) 20 MEQ tablet Take 1 tablet (20 mEq total) by mouth daily. 03/25/20 04/24/20  BrNaaman PlummerMD  prednisoLONE acetate (PRED FORTE) 1 % ophthalmic suspension  08/18/17   [provider]  sertraline (ZOLOFT) 100 MG tablet  10/11/09   [provider]  spironolactone (ALDACTONE) 25 MG tablet Take 1 tablet (25 mg total) by mouth daily. 06/05/17   KaGladstone LighterMD  tamsulosin (FLOMAX) 0.4 MG CAPS Take 0.8 mg by mouth every morning.  08/04/12   [provider]    Physical Exam: Vitals:    03/25/20 2300 03/25/20 2315 03/25/20 2330 03/25/20 2345  BP: 130/69  131/79   Pulse: (!) 51     Resp: (!) '22 17 14 13  ' Temp:      TempSrc:      SpO2: 97% 100% 100% 99%  Weight:      Height:          Constitutional: Hard of hearing, slightly confused, no distress Vitals:   03/25/20 2300 03/25/20 2315 03/25/20 2330 03/25/20 2345  BP: 130/69  131/79   Pulse: (!) 51     Resp: (!) '22 17 14 13  ' Temp:      TempSrc:      SpO2: 97% 100% 100% 99%  Weight:      Height:       Eyes: PERRL, lids and conjunctivae normal ENMT: Mucous membranes are dry. Posterior pharynx clear of any exudate or lesions.Normal dentition.  Neck: normal, supple, no masses, no thyromegaly Respiratory: Coarse breath sounds, clear to auscultation bilaterally, no wheezing, no crackles. Normal respiratory effort. No accessory muscle use.  Cardiovascular: Sinus bradycardia, no murmurs / rubs / gallops.  2+ extremity edema. 2+ pedal pulses. No carotid bruits.  Abdomen: no tenderness, no masses palpated. No hepatosplenomegaly. Bowel sounds positive.  Musculoskeletal: no clubbing / cyanosis. No joint deformity upper and lower extremities. Good ROM, no contractures. Normal muscle tone.  Skin: no rashes, lesions, ulcers. No induration Neurologic: CN 2-12 grossly intact. Sensation intact, DTR normal. Strength 5/5 in all 4.  Hard of hearing Psychiatric: Confused, slightly agitated    Labs on Admission: I have personally reviewed following labs and imaging studies  CBC: Recent Labs  Lab 03/25/20 2026  WBC 5.7  HGB 9.3*  HCT 27.4*  MCV 99.6  PLT 035*   Basic Metabolic Panel: Recent Labs  Lab 03/25/20 2026  NA 140  K 2.7*  CL 112*  CO2 18*  GLUCOSE 94  BUN 38*  CREATININE 1.98*  CALCIUM 7.4*   GFR: Estimated Creatinine Clearance: 31.7 mL/min (A) (by C-G formula based on SCr of 1.98 mg/dL (H)). Liver Function Tests: No results for input(s): AST, ALT, ALKPHOS, BILITOT, PROT, ALBUMIN in the last 168  hours. No results for input(s): LIPASE, AMYLASE in the last 168 hours. No results for input(s): AMMONIA in the last 168 hours. Coagulation Profile: No results for input(s): INR, PROTIME in the last 168 hours. Cardiac Enzymes: No results for input(s): CKTOTAL, CKMB, CKMBINDEX, TROPONINI in the last 168 hours. BNP (last 3 results) No results for input(s): PROBNP in the last 8760 hours. HbA1C: No results for input(s): HGBA1C in the last 72 hours. CBG: No results for input(s): GLUCAP in the last 168 hours. Lipid Profile: No results for input(s): CHOL, HDL, LDLCALC, TRIG, CHOLHDL, LDLDIRECT in the last 72 hours. Thyroid Function Tests: No results for input(s): TSH, T4TOTAL, FREET4, T3FREE, THYROIDAB in the last 72 hours. Anemia Panel: No results for input(s): VITAMINB12, FOLATE, FERRITIN, TIBC, IRON, RETICCTPCT in the last 72 hours. Urine analysis:    Component Value Date/Time   COLORURINE YELLOW (A) 03/25/2020 2157   APPEARANCEUR HAZY (A) 03/25/2020 2157   APPEARANCEUR Clear 12/01/2014 1129   LABSPEC 1.011 03/25/2020 2157   PHURINE 5.0 03/25/2020 2157   GLUCOSEU NEGATIVE 03/25/2020 2157   HGBUR NEGATIVE 03/25/2020 2157   BILIRUBINUR NEGATIVE 03/25/2020 2157   BILIRUBINUR Negative 12/01/2014 [place] 03/25/2020 2157   PROTEINUR NEGATIVE 03/25/2020 2157   UROBILINOGEN 0.2 02/09/2008 1639   NITRITE NEGATIVE 03/25/2020 2157   LEUKOCYTESUR NEGATIVE 03/25/2020 2157   Sepsis Labs: '@LABRCNTIP' (procalcitonin:4,lacticidven:4) )No results found for this or any previous visit (from the past 240 hour(s)).   Radiological Exams on Admission: DG Chest 2 View  Result Date: 03/25/2020 CLINICAL DATA:  Shortness of breath.  COVID positive. EXAM: CHEST - 2 VIEW COMPARISON:  01/19/2020 FINDINGS: There are subtle bilateral hazy airspace opacities with prominent interstitial lung markings. There is no pneumothorax. The cardiomediastinal silhouette is unremarkable. There are trace  bilateral pleural effusions. IMPRESSION: Findings consistent with an atypical infectious process. Trace bilateral pleural effusions. Electronically Signed   By: Constance Holster M.D.   On: 03/25/2020 20:55      Assessment/Plan Principal Problem:   AKI (acute kidney injury) ([place]) Active Problems:   Sleep apnea   HTN (hypertension)   Hyperlipidemia   Essential (primary) hypertension   Hypokalemia   Acute deep vein thrombosis (DVT) of axillary vein  of left upper extremity ([place])   COVID-19 virus infection     #1 acute kidney injury: Most likely prerenal due to dehydration.  Aggressively hydrate patient with saline.  Monitor renal function.  #2 hypokalemia: Place patient on telemetry.  IV potassium to correct.  Most likely dietary.  Check magnesium level.  #3 essential hypertension: Continue blood pressure medicine from home.  #4 obstructive sleep apnea: Confirm CPAP setting and ordered in the hospital.  #5 hyperlipidemia: Continue with statin  #6 COVID-19 infection: Currently asymptomatic but may be responsible for patient's failure to thrive dehydration as well as hypokalemia.  We will empirically treated with dexamethasone otherwise no further Covid medicines at this point.  #7 history of DVT: Confirm home regimen of possible Eliquis if confirmed switch.  #8 thrombocytopenia: No active bleeding.  Continue to monitor   DVT prophylaxis: Lovenox for now.  Ms. resume Eliquis if still active on med rec Code Status: Full code Family Communication: No family at bedside Disposition Plan: Home with the [facility] program Consults called: None Admission status: Inpatient  Severity of Illness: The appropriate patient status for this patient is INPATIENT. Inpatient status is judged to be reasonable and necessary in order to provide the required intensity of service to ensure the patient's safety. The patient's presenting symptoms, physical exam findings, and initial radiographic and  laboratory data in the context of their chronic comorbidities is felt to place them at high risk for further clinical deterioration. Furthermore, it is not anticipated that the patient will be medically stable for discharge from the hospital within 2 midnights of admission. The following factors support the patient status of inpatient.   " The patient's presenting symptoms include generalized weakness. " The worrisome physical exam findings include hard of hearing but weak. " The initial radiographic and laboratory data are worrisome because of potassium 2.7. " The chronic co-morbidities include BPH.   * I certify that at the point of admission it is my clinical judgment that the patient will require inpatient hospital care spanning beyond 2 midnights from the point of admission due to high intensity of service, high risk for further deterioration and high frequency of surveillance required.Barbette Merino MD [facility] Pager [phone]  If 7PM-7AM, please contact night-coverage [email or web address] Password [facility]  03/25/2020, 11:58 PM

## 2020-03-25 NOTE — ED Notes (Signed)
Date and time results received: 03/25/20 8:59 PM   Test: Potassium Critical Value: 2.7  Name of Provider Notified: Vicente Males MD

## 2020-03-25 NOTE — ED Notes (Signed)
This RN attempted to call wife at home, no answer.

## 2020-03-25 NOTE — ED Provider Notes (Signed)
[facility] Emergency Department Provider Note   ____________________________________________   First MD Initiated Contact with Patient 03/25/20 2103     (approximate)  I have reviewed the triage vital signs and the nursing notes.   HISTORY  Chief Complaint Weakness    HPI Brett Lam is a 74 y.o. male with a past medical history of vascular dementia, anxiety, hypertension, and recent diagnosis of Covid today who presents via EMS for worsening generalized weakness, bilateral leg pains, increasing bilateral lower extremity edema, and dry cough.  Patient is a poor historian and therefore history obtained from note that came with patient from his facility as well as EMS.  Patient has not needed any supplemental oxygenation in route.  No medications given by EMS.         Past Medical History:  Diagnosis Date  . Anxiety   . Dizzy spells   . Elephantiasis   . Elevated PSA   . Enlarged prostate   . Genital herpes   . GERD (gastroesophageal reflux disease)    better since wt loss  . H/O flexible sigmoidoscopy 2002   rectal bleeding   . Hyperlipidemia   . Hypertension   . Osteoarthritis    better since wt loss  . Patient is Jehovah's Witness   . Rectal bleed   . Sleep apnea    cpap - does not need since wt loss  . Urinary frequency   . Venous stasis   . Wears dentures    partial upper and lower    Patient Active Problem List   Diagnosis Date Noted  . Fracture of humerus, proximal, left, closed 09/21/2017  . DVT (deep vein thrombosis) in pregnancy 09/20/2017  . Acute deep vein thrombosis (DVT) of axillary vein of left upper extremity ([place]) 09/19/2017  . Hypokalemia 06/02/2017  . Diarrhea 06/24/2016  . Personal history of colonic polyps   . First degree hemorrhoids   . Chest tightness 09/28/2012  . Morbid obesity ([place]) 09/28/2012  . Sleep apnea 09/28/2012  . HTN (hypertension) 09/28/2012  . Hyperlipidemia 09/28/2012  . Essential  (primary) hypertension 09/28/2012  . 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 COLONOSCOPY    . COLONOSCOPY WITH PROPOFOL N/A 07/23/2015   Procedure: COLONOSCOPY WITH PROPOFOL;  Surgeon: Lucilla Lame, MD;  Location: [facility];  Service: Endoscopy;  Laterality: N/A;  . LAPAROSCOPIC GASTRIC RESTRICTIVE DUODENAL PROCEDURE (DUODENAL SWITCH) N/A 02/06/2015   Procedure: LAPAROSCOPIC GASTRIC RESTRICTIVE DUODENAL PROCEDURE (DUODENAL SWITCH);  Surgeon: Bonner Puna, MD;  Location: [facility];  Service: General;  Laterality: N/A;  . LIPOMA EXCISION  2004  . VEIN SURGERY Bilateral    legs    Prior to Admission medications   Medication Sig Start Date End Date Taking? Authorizing Provider  amLODipine (NORVASC) 5 MG tablet Take 1 tablet (5 mg total) by mouth daily. 09/21/17   Gladstone Lighter, MD  apixaban Arne Cleveland) KIT FOLLOW INSTRUCTIONS Patient taking differently: 5 each. FOLLOW INSTRUCTIONS 09/20/17   Gladstone Lighter, MD  atorvastatin (LIPITOR) 40 MG tablet Take 40 mg by mouth daily.    [provider]  Calcium Carbonate-Vit D-Min (CALTRATE 600+D PLUS MINERALS) 600-800 MG-UNIT TABS Take by mouth.    [provider]  Cholecalciferol 25 MCG (1000 UT) capsule Take by mouth.    [provider]  docusate sodium (COLACE) 100 MG capsule Take 1 tablet once or twice daily as needed for constipation while taking narcotic  pain medicine 09/08/17   Hinda Kehr, MD  donepezil (ARICEPT) 10 MG tablet Take 10 mg by mouth daily.    [provider]  doxazosin (CARDURA) 4 MG tablet doxazosin 4 mg tablet    [provider]  ELIQUIS STARTER PACK (ELIQUIS STARTER PACK) 5 MG TABS Take as directed on package: start with two-52m tablets twice daily for 7 days. On day 8, switch to one-535mtablet twice daily. 09/20/17   KaGladstone LighterMD  finasteride (PROSCAR) 5 MG tablet finasteride 5 mg tablet     [provider]  FLUoxetine (PROZAC) 40 MG capsule Take 1 capsule (40 mg total) by mouth daily. 06/05/17   KaGladstone LighterMD  fluticasone (FAsencion Islam50 MCG/ACT nasal spray  01/20/19   [provider]  ibuprofen (ADVIL,MOTRIN) 800 MG tablet  10/11/09   [provider]  lisinopril (PRINIVIL,ZESTRIL) 10 MG tablet Take 10 mg by mouth daily.    [provider]  lisinopril-hydrochlorothiazide (PReita May20-12.5 MG tablet  09/04/09   [provider]  lovastatin (MEVACOR) 20 MG tablet  09/05/09   [provider]  Multiple Vitamins-Minerals (MULTIVITAMIN & MINERAL PO) Take by mouth.    [provider]  oxyCODONE-acetaminophen (PERCOCET) 5-325 MG tablet Take 1-2 tablets by mouth every 6 (six) hours as needed for severe pain. 09/08/17   FoHinda KehrMD  potassium chloride SA (KLOR-CON) 20 MEQ tablet Take 1 tablet (20 mEq total) by mouth daily. 03/25/20 04/24/20  BrNaaman PlummerMD  prednisoLONE acetate (PRED FORTE) 1 % ophthalmic suspension  08/18/17   [provider]  sertraline (ZOLOFT) 100 MG tablet  10/11/09   [provider]  spironolactone (ALDACTONE) 25 MG tablet Take 1 tablet (25 mg total) by mouth daily. 06/05/17   KaGladstone LighterMD  tamsulosin (FLOMAX) 0.4 MG CAPS Take 0.8 mg by mouth every morning.  08/04/12   [provider]    Allergies Chlorpheniramine  Family History  Problem Relation Age of Onset  . Hypertension Other     Social History Social History   Tobacco Use  . Smoking status: Former Smoker    Packs/day: 1.00    Years: 6.00    Pack years: 6.00    Types: Cigarettes    Quit date: 01/26/1971    Years since quitting: 49.1  . Smokeless tobacco: Never Used  Substance Use Topics  . Alcohol use: No  . Drug use: No    Review of Systems Unable to assess  ____________________________________________   PHYSICAL EXAM:  VITAL SIGNS: ED Triage Vitals  Enc Vitals Group      BP 03/25/20 2016 (!) 84/56     Pulse Rate 03/25/20 2016 69     Resp 03/25/20 2016 18     Temp 03/25/20 2016 98.3 F (36.8 C)     Temp Source 03/25/20 2016 Oral     SpO2 03/25/20 2016 97 %     Weight 03/25/20 2017 179 lb 0.2 oz (81.2 kg)     Height 03/25/20 2017 _0  (1.727 m)     Head Circumference --      Peak Flow --      Pain Score --      Pain Loc --      Pain Edu? --      Excl. in [place]--    Constitutional: Alert and oriented. Well appearing and in no acute distress. Eyes: Conjunctivae are normal. PERRL. Head: Atraumatic. Nose: No congestion/rhinnorhea. Mouth/Throat: Mucous membranes are moist. Neck: No  stridor Cardiovascular: Grossly normal heart sounds.  Good peripheral circulation. Respiratory: Normal respiratory effort.  No retractions.  Mild rales over bilateral lower lungs Gastrointestinal: Soft and nontender. No distention. Musculoskeletal: No obvious deformities Neurologic:  Normal speech and language. No gross focal neurologic deficits are appreciated. Skin:  Skin is warm and dry. No rash noted. Psychiatric: Mood and affect are normal. Speech and behavior are normal.  ____________________________________________   LABS (all labs ordered are listed, but only abnormal results are displayed)  Labs Reviewed  BASIC METABOLIC PANEL - Abnormal; Notable for the following components:      Result Value   Potassium 2.7 (*)    Chloride 112 (*)    CO2 18 (*)    BUN 38 (*)    Creatinine, Ser 1.98 (*)    Calcium 7.4 (*)    GFR, Estimated 35 (*)    All other components within normal limits  CBC - Abnormal; Notable for the following components:   RBC 2.75 (*)    Hemoglobin 9.3 (*)    HCT 27.4 (*)    Platelets 106 (*)    All other components within normal limits  URINALYSIS, COMPLETE (UACMP) WITH MICROSCOPIC - Abnormal; Notable for the following components:   Color, Urine YELLOW (*)    APPearance HAZY (*)    All other components within normal limits  TROPONIN I  (HIGH SENSITIVITY)  TROPONIN I (HIGH SENSITIVITY)   ____________________________________________  EKG  ED ECG REPORT I, Naaman Plummer, the attending physician, personally viewed and interpreted this ECG.  Date: 03/25/2020 EKG Time: 2013 Rate: 70 Rhythm: normal sinus rhythm QRS Axis: normal Intervals: normal ST/T Wave abnormalities: normal Narrative Interpretation: no evidence of acute ischemia  ____________________________________________  RADIOLOGY  ED MD interpretation: 2 view x-ray of the chest shows evidence of subtle bilateral hazy airspace opacities and trace bilateral pleural effusions concerning for atypical infectious process and fitting with the recent diagnosis of Covid.  Official radiology report(s): DG Chest 2 View  Result Date: 03/25/2020 CLINICAL DATA:  Shortness of breath.  COVID positive. EXAM: CHEST - 2 VIEW COMPARISON:  01/19/2020 FINDINGS: There are subtle bilateral hazy airspace opacities with prominent interstitial lung markings. There is no pneumothorax. The cardiomediastinal silhouette is unremarkable. There are trace bilateral pleural effusions. IMPRESSION: Findings consistent with an atypical infectious process. Trace bilateral pleural effusions. Electronically Signed   By: Constance Holster M.D.   On: 03/25/2020 20:55    ____________________________________________   PROCEDURES  Procedure(s) performed (including Critical Care):  .1-3 Lead EKG Interpretation Performed by: Naaman Plummer, MD Authorized by: Naaman Plummer, MD     Interpretation: normal     ECG rate:  81   ECG rate assessment: normal     Rhythm: sinus rhythm     Ectopy: none     Conduction: abnormal   Comments:     RBBB     ____________________________________________   INITIAL IMPRESSION / ASSESSMENT AND PLAN / ED COURSE  As part of my medical decision making, I reviewed the following data within the [place] notes reviewed and  incorporated, Labs reviewed, EKG interpreted, Old chart reviewed, Radiograph reviewed and Notes from prior ED visits reviewed and incorporated        Presentation most consistent with Viral Syndrome.  Patient has tested positive for COVID-19. Based on vitals and exam they are nontoxic, not hypoxic, and stable for discharge.  Given History and Exam I have a lower suspicion for: Emergent CardioPulmonary causes [  such as Acute Asthma or COPD Exacerbation, acute Heart Failure or exacerbation, PE, PTX, atypical ACS, PNA]. Emergent Otolaryngeal causes [such as PTA, RPA, Ludwigs, Epiglottitis, EBV]. Patient also showed hypokalemia to 2.7 without any significant EKG changes.  Patient was given supplemental p.o. potassium chloride for replacement.  Patient was also given a prescription for 20 mEq of potassium chloride daily Regarding Emergent Travel or Immunosuppressive related infectious: I have a low suspicion for acute HIV.  Will provide strict return precautions and instructions on self-isolation/quarantine and anticipatory guidance.      ____________________________________________   FINAL CLINICAL IMPRESSION(S) / ED DIAGNOSES  Final diagnoses:  Hypokalemia  Generalized weakness  Decreased oral intake     ED Discharge Orders         Ordered    potassium chloride SA (KLOR-CON) 20 MEQ tablet  Daily        03/25/20 2234           Note:  This document was prepared using Dragon voice recognition software and may include unintentional dictation errors.   Naaman Plummer, MD 03/25/20 [phone]

## 2020-03-26 ENCOUNTER — Encounter: Payer: Self-pay | Admitting: Internal Medicine

## 2020-03-26 DIAGNOSIS — U071 COVID-19: Principal | ICD-10-CM

## 2020-03-26 DIAGNOSIS — I82A12 Acute embolism and thrombosis of left axillary vein: Secondary | ICD-10-CM

## 2020-03-26 LAB — COMPREHENSIVE METABOLIC PANEL
ALT: 52 U/L — ABNORMAL HIGH (ref 0–44)
AST: 49 U/L — ABNORMAL HIGH (ref 15–41)
Albumin: 2.1 g/dL — ABNORMAL LOW (ref 3.5–5.0)
Alkaline Phosphatase: 122 U/L (ref 38–126)
Anion gap: 8 (ref 5–15)
BUN: 40 mg/dL — ABNORMAL HIGH (ref 8–23)
CO2: 19 mmol/L — ABNORMAL LOW (ref 22–32)
Calcium: 7.3 mg/dL — ABNORMAL LOW (ref 8.9–10.3)
Chloride: 113 mmol/L — ABNORMAL HIGH (ref 98–111)
Creatinine, Ser: 1.96 mg/dL — ABNORMAL HIGH (ref 0.61–1.24)
GFR, Estimated: 35 mL/min — ABNORMAL LOW (ref >60)
Glucose, Bld: 93 mg/dL (ref 70–99)
Potassium: 3 mmol/L — ABNORMAL LOW (ref 3.5–5.1)
Sodium: 140 mmol/L (ref 135–145)
Total Bilirubin: 1 mg/dL (ref 0.3–1.2)
Total Protein: 4.7 g/dL — ABNORMAL LOW (ref 6.5–8.1)

## 2020-03-26 LAB — CBC
HCT: 26.2 % — ABNORMAL LOW (ref 39.0–52.0)
HCT: 27.4 % — ABNORMAL LOW (ref 39.0–52.0)
Hemoglobin: 8.8 g/dL — ABNORMAL LOW (ref 13.0–17.0)
Hemoglobin: 9.4 g/dL — ABNORMAL LOW (ref 13.0–17.0)
MCH: 33.6 pg (ref 26.0–34.0)
MCH: 33.7 pg (ref 26.0–34.0)
MCHC: 33.6 g/dL (ref 30.0–36.0)
MCHC: 34.3 g/dL (ref 30.0–36.0)
MCV: 100 fL (ref 80.0–100.0)
MCV: 98.2 fL (ref 80.0–100.0)
Platelets: 81 10*3/uL — ABNORMAL LOW (ref 150–400)
Platelets: 87 10*3/uL — ABNORMAL LOW (ref 150–400)
RBC: 2.62 MIL/uL — ABNORMAL LOW (ref 4.22–5.81)
RBC: 2.79 MIL/uL — ABNORMAL LOW (ref 4.22–5.81)
RDW: 13.2 % (ref 11.5–15.5)
RDW: 13.2 % (ref 11.5–15.5)
WBC: 4.3 10*3/uL (ref 4.0–10.5)
WBC: 4.4 10*3/uL (ref 4.0–10.5)
nRBC: 0 % (ref 0.0–0.2)
nRBC: 0 % (ref 0.0–0.2)

## 2020-03-26 LAB — CREATININE, SERUM
Creatinine, Ser: 1.9 mg/dL — ABNORMAL HIGH (ref 0.61–1.24)
GFR, Estimated: 37 mL/min — ABNORMAL LOW (ref >60)

## 2020-03-26 MED ORDER — SORBITOL 70 % SOLN
30.0000 mL | Status: DC | PRN
  Filled 2020-03-26: qty 30

## 2020-03-26 MED ORDER — ACETAMINOPHEN 325 MG PO TABS: 650.0000 mg | ORAL_TABLET | Freq: Four times a day (QID) | ORAL | Status: DC | PRN

## 2020-03-26 MED ORDER — FAMOTIDINE IN NACL 20-0.9 MG/50ML-% IV SOLN: 20.0000 mg | Freq: Once | INTRAVENOUS | Status: DC | PRN

## 2020-03-26 MED ORDER — SODIUM BICARBONATE 650 MG PO TABS
1300.0000 mg | ORAL_TABLET | Freq: Two times a day (BID) | ORAL | Status: DC
  Administered 2020-03-26 – 2020-04-02 (×13): 1300 mg via ORAL
  Filled 2020-03-26 (×15): qty 2

## 2020-03-26 MED ORDER — POTASSIUM CHLORIDE 10 MEQ/100ML IV SOLN
10.0000 meq | INTRAVENOUS | Status: AC
  Administered 2020-03-26 (×2): 10 meq via INTRAVENOUS

## 2020-03-26 MED ORDER — CALCIUM CARBONATE ANTACID 1250 MG/5ML PO SUSP
500.0000 mg | Freq: Four times a day (QID) | ORAL | Status: DC | PRN
  Filled 2020-03-26: qty 5

## 2020-03-26 MED ORDER — ENOXAPARIN SODIUM 30 MG/0.3ML ~~LOC~~ SOLN
30.0000 mg | SUBCUTANEOUS | Status: DC
  Administered 2020-03-26: 30 mg via SUBCUTANEOUS
  Filled 2020-03-26: qty 0.3

## 2020-03-26 MED ORDER — ONDANSETRON HCL 4 MG PO TABS: 4.0000 mg | ORAL_TABLET | Freq: Four times a day (QID) | ORAL | Status: DC | PRN

## 2020-03-26 MED ORDER — SODIUM CHLORIDE 0.9 % IV SOLN
Freq: Once | INTRAVENOUS | Status: DC
  Filled 2020-03-26: qty 5

## 2020-03-26 MED ORDER — EPINEPHRINE 0.3 MG/0.3ML IJ SOAJ
0.3000 mg | Freq: Once | INTRAMUSCULAR | Status: DC | PRN
  Filled 2020-03-26: qty 0.3

## 2020-03-26 MED ORDER — APIXABAN (ELIQUIS) EDUCATION KIT FOR DVT/PE PATIENTS: 5.0000 | PACK | Freq: Two times a day (BID) | Status: DC

## 2020-03-26 MED ORDER — POTASSIUM CHLORIDE IN NACL 20-0.9 MEQ/L-% IV SOLN
INTRAVENOUS | Status: AC
  Filled 2020-03-26: qty 1000

## 2020-03-26 MED ORDER — OXYCODONE-ACETAMINOPHEN 5-325 MG PO TABS: 1.0000 | ORAL_TABLET | Freq: Four times a day (QID) | ORAL | Status: DC | PRN

## 2020-03-26 MED ORDER — APIXABAN 5 MG PO TABS
5.0000 mg | ORAL_TABLET | Freq: Two times a day (BID) | ORAL | Status: DC
  Administered 2020-03-26: 5 mg via ORAL
  Filled 2020-03-26 (×2): qty 1

## 2020-03-26 MED ORDER — POTASSIUM CHLORIDE 10 MEQ/100ML IV SOLN
10.0000 meq | INTRAVENOUS | Status: AC
  Filled 2020-03-26: qty 100

## 2020-03-26 MED ORDER — TAMSULOSIN HCL 0.4 MG PO CAPS
0.8000 mg | ORAL_CAPSULE | ORAL | Status: DC
  Administered 2020-03-28 – 2020-04-02 (×6): 0.8 mg via ORAL
  Filled 2020-03-26 (×8): qty 2

## 2020-03-26 MED ORDER — DIPHENHYDRAMINE HCL 50 MG/ML IJ SOLN: 50.0000 mg | Freq: Once | INTRAMUSCULAR | Status: DC | PRN

## 2020-03-26 MED ORDER — FLUOXETINE HCL 20 MG PO CAPS
40.0000 mg | ORAL_CAPSULE | Freq: Every day | ORAL | Status: DC
  Administered 2020-03-26 – 2020-04-02 (×7): 40 mg via ORAL
  Filled 2020-03-26 (×8): qty 2

## 2020-03-26 MED ORDER — HYDROXYZINE HCL 25 MG PO TABS
25.0000 mg | ORAL_TABLET | Freq: Three times a day (TID) | ORAL | Status: DC | PRN
  Administered 2020-04-02: 08:00:00 25 mg via ORAL
  Filled 2020-03-26 (×2): qty 1

## 2020-03-26 MED ORDER — CAMPHOR-MENTHOL 0.5-0.5 % EX LOTN
1.0000 "application " | TOPICAL_LOTION | Freq: Three times a day (TID) | CUTANEOUS | Status: DC | PRN
  Filled 2020-03-26: qty 222

## 2020-03-26 MED ORDER — SODIUM CHLORIDE 0.9 % IV SOLN
1200.0000 mg | Freq: Once | INTRAVENOUS | Status: AC
  Administered 2020-03-26: 21:00:00 1200 mg via INTRAVENOUS
  Filled 2020-03-26: qty 10

## 2020-03-26 MED ORDER — DONEPEZIL HCL 5 MG PO TABS
10.0000 mg | ORAL_TABLET | Freq: Every day | ORAL | Status: DC
  Administered 2020-03-26 – 2020-04-01 (×7): 10 mg via ORAL
  Filled 2020-03-26 (×7): qty 2

## 2020-03-26 MED ORDER — NEPRO/CARBSTEADY PO LIQD: 237.0000 mL | Freq: Three times a day (TID) | ORAL | Status: DC | PRN

## 2020-03-26 MED ORDER — ZOLPIDEM TARTRATE 5 MG PO TABS
5.0000 mg | ORAL_TABLET | Freq: Every evening | ORAL | Status: DC | PRN
  Administered 2020-03-26: 21:00:00 5 mg via ORAL
  Filled 2020-03-26: qty 1

## 2020-03-26 MED ORDER — METHYLPREDNISOLONE SODIUM SUCC 125 MG IJ SOLR: 125.0000 mg | Freq: Once | INTRAMUSCULAR | Status: DC | PRN

## 2020-03-26 MED ORDER — ATORVASTATIN CALCIUM 20 MG PO TABS
40.0000 mg | ORAL_TABLET | Freq: Every day | ORAL | Status: DC
  Administered 2020-03-26 – 2020-04-01 (×7): 40 mg via ORAL
  Filled 2020-03-26 (×8): qty 2

## 2020-03-26 MED ORDER — KCL IN DEXTROSE-NACL 40-5-0.9 MEQ/L-%-% IV SOLN
INTRAVENOUS | Status: DC
  Filled 2020-03-26 (×3): qty 1000

## 2020-03-26 MED ORDER — DOCUSATE SODIUM 283 MG RE ENEM
1.0000 | ENEMA | RECTAL | Status: DC | PRN
  Filled 2020-03-26: qty 1

## 2020-03-26 MED ORDER — ONDANSETRON HCL 4 MG/2ML IJ SOLN: 4.0000 mg | Freq: Four times a day (QID) | INTRAMUSCULAR | Status: DC | PRN

## 2020-03-26 MED ORDER — DOXAZOSIN MESYLATE 1 MG PO TABS
1.0000 mg | ORAL_TABLET | Freq: Every day | ORAL | Status: DC
  Administered 2020-03-26 – 2020-04-01 (×7): 1 mg via ORAL
  Filled 2020-03-26 (×8): qty 1

## 2020-03-26 MED ORDER — ACETAMINOPHEN 650 MG RE SUPP: 650.0000 mg | Freq: Four times a day (QID) | RECTAL | Status: DC | PRN

## 2020-03-26 MED ORDER — FLUOXETINE HCL 20 MG PO TABS
40.0000 mg | ORAL_TABLET | Freq: Every day | ORAL | Status: DC
  Filled 2020-03-26: qty 2

## 2020-03-26 MED ORDER — ALBUTEROL SULFATE HFA 108 (90 BASE) MCG/ACT IN AERS
2.0000 | INHALATION_SPRAY | Freq: Once | RESPIRATORY_TRACT | Status: DC | PRN
  Filled 2020-03-26: qty 6.7

## 2020-03-26 MED ORDER — AMLODIPINE BESYLATE 5 MG PO TABS
5.0000 mg | ORAL_TABLET | Freq: Every day | ORAL | Status: DC
  Administered 2020-03-26 – 2020-04-02 (×7): 5 mg via ORAL
  Filled 2020-03-26 (×8): qty 1

## 2020-03-26 MED ORDER — SODIUM CHLORIDE 0.9 % IV SOLN: INTRAVENOUS | Status: DC | PRN

## 2020-03-26 MED ORDER — DEXAMETHASONE SODIUM PHOSPHATE 10 MG/ML IJ SOLN
8.0000 mg | INTRAMUSCULAR | Status: DC
  Administered 2020-03-26 (×2): 8 mg via INTRAVENOUS
  Filled 2020-03-26 (×2): qty 1

## 2020-03-26 NOTE — Progress Notes (Signed)
PROGRESS NOTE    Brett Lam  ZLD:[id number] DOB: 1945-05-31 DOA: 03/25/2020 PCP: Kern Alberta, MD   Chief complaint.  General weakness. Brief Narrative:   Brett Lam is a 74 y.o. male with medical history significant of obstructive sleep apnea, hypertension, hyperlipidemia, GERD, elephantiasis, BPH, hard of hearing and osteoarthritis who was diagnosed with COVID-19 about 5 days ago through the pace program.  I spoke with the patient and wife, patient has been falling at home.  He has general weakness. By the time he came to the hospital, patient found to have acute kidney failure with hypotension.  He was given fluids.  Chest x-ray showed atypical pneumonia consistent with Covid.   Assessment & Plan:   Principal Problem:   AKI (acute kidney injury) (HCC) Active Problems:   Sleep apnea   HTN (hypertension)   Hyperlipidemia   Essential (primary) hypertension   Hypokalemia   Acute deep vein thrombosis (DVT) of axillary vein of left upper extremity (HCC)   COVID-19 virus infection  #1.  Covid pneumonia. Patient currently has no symptoms, no hypoxemia.  I was called by patient primary care physician in [place], who requested mono antibody infusion. Based on criteria, patient qualifies based on his age and chronic kidney disease stage II. I have discussed with the patient and his wife, both agreed.  2.  Acute kidney injury on chronic kidney disease stage II, hyponatremia, hypokalemia.  Metabolic acidosis. Appear to be secondary to dehydration. We will give gentle rehydration, supplement potassium. Add sodium bicarbonate orally.  3.  Failure to thrive. Obtain physical therapy occupational therapy consult.  4.  Obstructive sleep apnea. Continue CPAP while asleep.  4.  Thrombocytopenia and anemia. Check iron and B12 level.  5 DVT. Resume Eliquis.  6.  Right bundle branch block. Reviewed EKGs at this admission and a 2 years ago.  Did not see any atrial  fibrillation.    DVT prophylaxis: Eliquis Code Status: Full Family Communication: Wife updated. Disposition Plan:  .   Status is: Inpatient  Remains inpatient appropriate because:Inpatient level of care appropriate due to severity of illness   Dispo: The patient is from: Home              Anticipated d/c is to: ?              Anticipated d/c date is: 2 days              Patient currently is not medically stable to d/c.        No intake/output data recorded. Total I/O In: -  Out: 200 [Urine:200]     Consultants:   None  Procedures: None  Antimicrobials: None  Subjective: Patient states that he is doing well today.  He denies any short of breath, no cough, no fever or chills. He denies any abdominal pain.  No nausea vomiting.  Poor appetite. No dysuria hematuria. No dizziness or headaches.   Objective: Vitals:   03/26/20 1230 03/26/20 1300 03/26/20 1330 03/26/20 1400  BP: (!) 142/75 135/83 136/72   Pulse: (!) 44 (!) 48 (!) 48 (!) 40  Resp: 15 14 16 16   Temp:      TempSrc:      SpO2: 98% 100% 100% 100%  Weight:      Height:        Intake/Output Summary (Last 24 hours) at 03/26/2020 1425 Last data filed at 03/26/2020 1004 Gross per 24 hour  Intake --  Output 200 ml  Net -  200 ml   Filed Weights   03/25/20 2017 03/26/20 0108  Weight: 81.2 kg 60.4 kg    Examination:  General exam: Appears calm and comfortable  Respiratory system: Clear to auscultation. Respiratory effort normal. Cardiovascular system: S1 & S2 heard, RRR. No JVD, murmurs, rubs, gallops or clicks. No pedal edema. Gastrointestinal system: Abdomen is nondistended, soft and nontender. No organomegaly or masses felt. Normal bowel sounds heard. Central nervous system: Alert and oriented x3.  No focal neurological deficits. Extremities: Symmetric . Skin: No rashes, lesions or ulcers Psychiatry:  Mood & affect appropriate.     Data Reviewed: I have personally reviewed following labs  and imaging studies  CBC: Recent Labs  Lab 03/25/20 2026 03/26/20 0109 03/26/20 0432  WBC 5.7 4.3 4.4  HGB 9.3* 8.8* 9.4*  HCT 27.4* 26.2* 27.4*  MCV 99.6 100.0 98.2  PLT 106* 81* 87*   Basic Metabolic Panel: Recent Labs  Lab 03/25/20 2026 03/26/20 0109 03/26/20 0432  NA 140  --  140  K 2.7*  --  3.0*  CL 112*  --  113*  CO2 18*  --  19*  GLUCOSE 94  --  93  BUN 38*  --  40*  CREATININE 1.98* 1.90* 1.96*  CALCIUM 7.4*  --  7.3*   GFR: Estimated Creatinine Clearance: 28.2 mL/min (A) (by C-G formula based on SCr of 1.96 mg/dL (H)). Liver Function Tests: Recent Labs  Lab 03/26/20 0432  AST 49*  ALT 52*  ALKPHOS 122  BILITOT 1.0  PROT 4.7*  ALBUMIN 2.1*   No results for input(s): LIPASE, AMYLASE in the last 168 hours. No results for input(s): AMMONIA in the last 168 hours. Coagulation Profile: No results for input(s): INR, PROTIME in the last 168 hours. Cardiac Enzymes: No results for input(s): CKTOTAL, CKMB, CKMBINDEX, TROPONINI in the last 168 hours. BNP (last 3 results) No results for input(s): PROBNP in the last 8760 hours. HbA1C: No results for input(s): HGBA1C in the last 72 hours. CBG: No results for input(s): GLUCAP in the last 168 hours. Lipid Profile: No results for input(s): CHOL, HDL, LDLCALC, TRIG, CHOLHDL, LDLDIRECT in the last 72 hours. Thyroid Function Tests: No results for input(s): TSH, T4TOTAL, FREET4, T3FREE, THYROIDAB in the last 72 hours. Anemia Panel: No results for input(s): VITAMINB12, FOLATE, FERRITIN, TIBC, IRON, RETICCTPCT in the last 72 hours. Sepsis Labs: No results for input(s): PROCALCITON, LATICACIDVEN in the last 168 hours.  No results found for this or any previous visit (from the past 240 hour(s)).       Radiology Studies: DG Chest 2 View  Result Date: 03/25/2020 CLINICAL DATA:  Shortness of breath.  COVID positive. EXAM: CHEST - 2 VIEW COMPARISON:  01/19/2020 FINDINGS: There are subtle bilateral hazy airspace  opacities with prominent interstitial lung markings. There is no pneumothorax. The cardiomediastinal silhouette is unremarkable. There are trace bilateral pleural effusions. IMPRESSION: Findings consistent with an atypical infectious process. Trace bilateral pleural effusions. Electronically Signed   By: Katherine Mantle M.D.   On: 03/25/2020 20:55        Scheduled Meds: . dexamethasone (DECADRON) injection  8 mg Intravenous Q24H  . enoxaparin (LOVENOX) injection  30 mg Subcutaneous Q24H   Continuous Infusions: . sodium chloride    . 0.9 % NaCl with KCl 20 mEq / L    . casirivimab-imdevimab IV    . famotidine (PEPCID) IV    . potassium chloride       LOS: 1 day  Time spent: 35 minutes    Marrion Coy, MD [facility]   To contact the attending provider between 7A-7P or the covering provider during after hours 7P-7A, please log into the web site [email or web address] and access using universal [place] password for that web site. If you do not have the password, please call the hospital operator.  03/26/2020, 2:25 PM

## 2020-03-26 NOTE — TOC Initial Note (Addendum)
Transition of Care [facility]) - Initial/Assessment Note    Patient Details  Name: Brett Lam MRN: [id number] Date of Birth: 10-Jul-1945  Transition of [facility]) CM/SW Contact:    Brett Butcher, RN Phone Number: 03/26/2020, 4:42 PM  Clinical Narrative:                 Patient admitted to the hospital with COVID, no having any COVID symptoms but weak and AKI.  Patient is part of the [facility] program and has been living at home with his wife.  Brett Lam, Child psychotherapist with [facility], tells this RNCM that the patient's wife can no longer care for him and they have been working on long term care placement at [facility].  Patient tested + for COVID on 11/5- [facility] will accept patient's 10 days post COVID diagnosis.  RNCM will start SNF workup.  Limited facilities that accept COVID patient's none in [facility].    Dr. Layla Lam is the patient's provider at [facility] her contact is [phone].  TOC team will keep [facility] updated on plan of care.   Expected Discharge Plan: Skilled Nursing Facility Barriers to Discharge: Continued Medical Work up   Patient Goals and CMS Choice Patient states their goals for this hospitalization and ongoing recovery are:: Patient needs long term care per wife and [facility] CMS Medicare.gov Compare Post Acute Care list provided to:: Patient Represenative (must comment) Choice offered to / list presented to : Spouse  Expected Discharge Plan and Services Expected Discharge Plan: Skilled Nursing Facility   Discharge Planning Services: CM Consult Post Acute Care Choice: Skilled Nursing Facility Living arrangements for the past 2 months: Single Family Home                   DME Agency: NA       HH Arranged: NA          Prior Living Arrangements/Services Living arrangements for the past 2 months: Single Family Home Lives with:: Spouse Patient language and need for interpreter reviewed:: Yes Do you feel safe going back to the place where you live?: Yes      Need for  Family Participation in Patient Care: Yes (Comment) (advanced dementia) Care giver support system in place?: Yes (comment) ([facility])   Criminal Activity/Legal Involvement Pertinent to Current Situation/Hospitalization: No - Comment as needed  Activities of Daily Living      Permission Sought/Granted Permission sought to share information with : Case Manager, Family Supports, Magazine features editor Permission granted to share information with : Yes, Verbal Permission Granted  Share Information with NAME: Brett Lam and Dr. Providence Lam  Permission granted to share info w AGENCY: [facility]  Permission granted to share info w Relationship: SW and MD  Permission granted to share info w Contact Information: [facility] [phone] and Dr. Layla Lam [phone]  Emotional Assessment         Alcohol / Substance Use: Not Applicable Psych Involvement: No (comment)  Admission diagnosis:  Hypokalemia [E87.6] Failure to thrive in adult [R62.7] Decreased oral intake [R63.8] Generalized weakness [R53.1] AKI (acute kidney injury) (HCC) [N17.9] COVID-19 [U07.1] Patient Active Problem List   Diagnosis Date Noted  . COVID-19 virus infection 03/25/2020  . AKI (acute kidney injury) (HCC) 03/25/2020  . Fracture of humerus, proximal, left, closed 09/21/2017  . DVT (deep vein thrombosis) in pregnancy 09/20/2017  . Acute deep vein thrombosis (DVT) of axillary vein of left upper extremity (HCC) 09/19/2017  . Hypokalemia 06/02/2017  . Diarrhea 06/24/2016  . Personal  history of colonic polyps   . First degree hemorrhoids   . Chest tightness 09/28/2012  . Morbid obesity (HCC) 09/28/2012  . Sleep apnea 09/28/2012  . HTN (hypertension) 09/28/2012  . Hyperlipidemia 09/28/2012  . Essential (primary) hypertension 09/28/2012  . Familial multiple lipoprotein-type hyperlipidemia 09/28/2012   PCP:  Brett Alberta, MD Pharmacy:   [facility] - [place], [place] - [address] [place] [address] Phone: [phone] Fax: [phone]  [facility] [place], [place] - [address] [place] [address] Phone: [phone] Fax: [phone]     Social Determinants of Health (SDOH) Interventions    Readmission Risk Interventions No flowsheet data found.

## 2020-03-26 NOTE — ED Notes (Signed)
Pt given coffee at this time. Pt denies any further needs.

## 2020-03-26 NOTE — ED Notes (Signed)
Pt given applesauce per request.

## 2020-03-26 NOTE — ED Notes (Signed)
Pt given meal tray.

## 2020-03-26 NOTE — ED Notes (Signed)
Pt given breakfast tray

## 2020-03-26 NOTE — Progress Notes (Signed)
Pt with multiple pressure injuries and deep tissue injury to buttocks. Pt with multiple skin tears to left arm.

## 2020-03-26 NOTE — Progress Notes (Signed)
Pharmacy COVID-19 Monoclonal Antibody Screening  Brett Lam was identified as being not hospitalized with symptoms from Covid-19 on admission but an incidental positive PCR has been documented.  The patient may qualify for the use of monoclonal antibodies (mAB) for COVID-19 viral infection to prevent worsening symptoms stemming from Covid-19 infection.  The patient was identified based on a positive COVID-19 PCR and not requiring the use of supplemental oxygen at this time.  This patient meets the FDA criteria for Emergency Use Authorization of casirivimab/imdevimab or bamlanivimab/etesevimab.  Has a (+) direct SARS-CoV-2 viral test result  Is NOT hospitalized due to COVID-19  Is within 10 days of symptom onset  Has at least one of the high risk factor(s) for progression to severe COVID-19 and/or hospitalization as defined in EUA.  Specific high risk criteria : Older age (>/= 74 yo), Chronic Kidney Disease (CKD) and Cardiovascular disease or hypertension  Additionally: The patient has had a positive COVID-19 PCR in the last 90 days.  The patient has been fully vaccinated against COVID-19.    Since the patient is partially or fully vaccinated for COVID-19, has mild symptoms, and meets high risk criteria, the patient is eligible for mAB administration.   This eligibility and indication for treatment was discussed with the patient's physician: Dr. Chipper Herb   Plan: Based on the above discussion, it was decided that the patient will receive one dose of the available COVID-19 mAB combination. Pharmacy will coordinate administration timing with patient's nurse. Recommended infusion monitoring parameters communicated to the nursing team.   [name], PharmD, BCPS Clinical Pharmacist 03/26/2020 2:50 PM

## 2020-03-26 NOTE — ED Notes (Signed)
Admitting MD at bedside.

## 2020-03-26 NOTE — ED Notes (Signed)
Pt heart rate noted to be 39, all other VSS, patient resting in bed with eyes closed. Mikeal Hawthorne MD paged to make aware.

## 2020-03-26 NOTE — Progress Notes (Signed)
Anticoagulation monitoring(Lovenox):  74 yo male ordered Lovenox 40 mg Q24h  Filed Weights   03/25/20 2017 03/26/20 0108  Weight: 81.2 kg (179 lb 0.2 oz) 60.4 kg (133 lb 2.5 oz)   BMI    Lab Results  Component Value Date   CREATININE 1.96 (H) 03/26/2020   CREATININE 1.90 (H) 03/26/2020   CREATININE 1.98 (H) 03/25/2020   Estimated Creatinine Clearance: 28.2 mL/min (A) (by C-G formula based on SCr of 1.96 mg/dL (H)). Hemoglobin & Hematocrit     Component Value Date/Time   HGB 9.4 (L) 03/26/2020 0432   HCT 27.4 (L) 03/26/2020 7034     Per Protocol for Patient with estCrcl < 30 ml/min and BMI < 40, will transition to Lovenox 40 mg Q24h.

## 2020-03-27 DIAGNOSIS — L8990 Pressure ulcer of unspecified site, unspecified stage: Secondary | ICD-10-CM | POA: Insufficient documentation

## 2020-03-27 LAB — BASIC METABOLIC PANEL
Anion gap: 6 (ref 5–15)
BUN: 39 mg/dL — ABNORMAL HIGH (ref 8–23)
CO2: 18 mmol/L — ABNORMAL LOW (ref 22–32)
Calcium: 7.2 mg/dL — ABNORMAL LOW (ref 8.9–10.3)
Chloride: 115 mmol/L — ABNORMAL HIGH (ref 98–111)
Creatinine, Ser: 1.61 mg/dL — ABNORMAL HIGH (ref 0.61–1.24)
GFR, Estimated: 45 mL/min — ABNORMAL LOW (ref >60)
Glucose, Bld: 107 mg/dL — ABNORMAL HIGH (ref 70–99)
Potassium: 3.4 mmol/L — ABNORMAL LOW (ref 3.5–5.1)
Sodium: 139 mmol/L (ref 135–145)

## 2020-03-27 LAB — CBC WITH DIFFERENTIAL/PLATELET
Abs Immature Granulocytes: 0.03 10*3/uL (ref 0.00–0.07)
Basophils Absolute: 0 10*3/uL (ref 0.0–0.1)
Basophils Relative: 0 %
Eosinophils Absolute: 0 10*3/uL (ref 0.0–0.5)
Eosinophils Relative: 0 %
HCT: 26.9 % — ABNORMAL LOW (ref 39.0–52.0)
Hemoglobin: 9.5 g/dL — ABNORMAL LOW (ref 13.0–17.0)
Immature Granulocytes: 1 %
Lymphocytes Relative: 17 %
Lymphs Abs: 0.7 10*3/uL (ref 0.7–4.0)
MCH: 34.1 pg — ABNORMAL HIGH (ref 26.0–34.0)
MCHC: 35.3 g/dL (ref 30.0–36.0)
MCV: 96.4 fL (ref 80.0–100.0)
Monocytes Absolute: 0.1 10*3/uL (ref 0.1–1.0)
Monocytes Relative: 2 %
Neutro Abs: 3.4 10*3/uL (ref 1.7–7.7)
Neutrophils Relative %: 80 %
Platelets: 94 10*3/uL — ABNORMAL LOW (ref 150–400)
RBC: 2.79 MIL/uL — ABNORMAL LOW (ref 4.22–5.81)
RDW: 13 % (ref 11.5–15.5)
WBC: 4.3 10*3/uL (ref 4.0–10.5)
nRBC: 0 % (ref 0.0–0.2)

## 2020-03-27 LAB — IRON AND TIBC: Iron: 75 ug/dL (ref 45–182)

## 2020-03-27 LAB — MAGNESIUM: Magnesium: 1.8 mg/dL (ref 1.7–2.4)

## 2020-03-27 LAB — VITAMIN B12: Vitamin B-12: 919 pg/mL — ABNORMAL HIGH (ref 180–914)

## 2020-03-27 MED ORDER — TRAZODONE HCL 50 MG PO TABS
50.0000 mg | ORAL_TABLET | Freq: Every day | ORAL | Status: DC
  Administered 2020-03-27 – 2020-04-01 (×6): 50 mg via ORAL
  Filled 2020-03-27 (×6): qty 1

## 2020-03-27 MED ORDER — QUETIAPINE FUMARATE 25 MG PO TABS
12.5000 mg | ORAL_TABLET | Freq: Every day | ORAL | Status: DC
  Administered 2020-03-27 – 2020-03-30 (×4): 12.5 mg via ORAL
  Filled 2020-03-27 (×4): qty 1

## 2020-03-27 MED ORDER — ENOXAPARIN SODIUM 40 MG/0.4ML ~~LOC~~ SOLN
40.0000 mg | SUBCUTANEOUS | Status: DC
  Administered 2020-03-27 – 2020-04-01 (×6): 40 mg via SUBCUTANEOUS
  Filled 2020-03-27 (×6): qty 0.4

## 2020-03-27 NOTE — Progress Notes (Signed)
Attempted to call pt's wife, no answer.

## 2020-03-27 NOTE — Progress Notes (Addendum)
PROGRESS NOTE    Brett Lam  QQP:[id number] DOB: [birth year] DOA: 03/25/2020 PCP: Kern Alberta, MD   Chief complaint. Generalized weakness. Brief Narrative:  Brett Cogburn Russois a 74 y.o.malewith medical history significant ofobstructive sleep apnea, hypertension, hyperlipidemia, GERD, elephantiasis, BPH, hard of hearing and osteoarthritis who was diagnosed with COVID-19 about 5 days ago through the pace program.  I spoke with the patient and wife, patient has been falling at home.  He has general weakness. By the time he came to the hospital, patient found to have acute kidney failure with hypotension.  He was given fluids.  Chest x-ray showed atypical pneumonia consistent with Covid. Patient was placed on IV fluids, also received mnoantibody for Covid.   Assessment & Plan:   Principal Problem:   AKI (acute kidney injury) (HCC) Active Problems:   Sleep apnea   HTN (hypertension)   Hyperlipidemia   Essential (primary) hypertension   Hypokalemia   Acute deep vein thrombosis (DVT) of axillary vein of left upper extremity (HCC)   COVID-19 virus infection   Pressure injury of skin  #1. Covid pneumonia. Patient currently has no symptoms. Received mono antibiotics. Currently patient does not have any hypoxemia.  2. Acute kidney injury on chronic kidney disease stage II. Hyponatremia. Hypokalemia and metabolic acidosis. Continue IV fluids, continue potassium supplement. Continue oral sodium bicarbonate.  #3. Failure to thrive. Continue physical therapy and Occupational Therapy.  4. Obstructive sleep apnea. Continue CPAP while sleeping  4. Thrombocytopenia and anemia. Check iron B12 level.  5. History of  DVT. Reviewed all previous charts, looks like a he had a provoked DVT more than a year ago, Eliquis has completed, change anticoagulation to prophylactic Lovenox.  6. Right bundle branch block.  7. Weight loss. Per patient PCP, patient has been losing weight.  Will refer outpatient to GI for EGD and colonoscopy after discharge.  Spoke with the patient PCP Dr. Providence Lanius, patient has been deemed to be incompetent to make his own decisions. He has significant dementia and memory issues. The wife also has some memory issue, but currently is Management consultant. Patient has been falling at home, will need nursing home placement for rehab.  CODE STATUS.  Patient has been in DO NOT RESUSCITATE status based on record.    DVT prophylaxis: Lovenox Code Status: DNR Family Communication: Not able to reach wife, discussed with patient PCP. Disposition Plan:     Status is: Inpatient  Remains inpatient appropriate because:Inpatient level of care appropriate due to severity of illness   Dispo: The patient is from: Home              Anticipated d/c is to: SNF              Anticipated d/c date is: 2 days              Patient currently is not medically stable to d/c.        I/O last 3 completed shifts: In: 413.3 [I.V.:413.3] Out: 200 [Urine:200] No intake/output data recorded.     Consultants:   None  Procedures: None  Antimicrobials: None  Subjective: Patient is quite confused this morning, he slept with the CPAP last night. Denies any short of breath or cough. No abdominal pain nausea vomiting. No diarrhea. Denies any headaches or dizziness. No fever or chills.  Objective: Vitals:   03/26/20 2122 03/26/20 2335 03/27/20 0046 03/27/20 0757  BP: 114/68 103/64 110/72 122/83  Pulse: (!) 59 63 72 66  Resp: 15  16 16 17   Temp: 98.5 F (36.9 C) 97.9 F (36.6 C) 98 F (36.7 C) (!) 97.5 F (36.4 C)  TempSrc: Oral Oral  Oral  SpO2:  99% 98% 97%  Weight:      Height:        Intake/Output Summary (Last 24 hours) at 03/27/2020 0939 Last data filed at 03/26/2020 1828 Gross per 24 hour  Intake 413.31 ml  Output 200 ml  Net 213.31 ml   Filed Weights   03/25/20 2017 03/26/20 0108  Weight: 81.2 kg 60.4 kg    Examination:  General exam:  Appears calm and comfortable  Respiratory system: Clear to auscultation. Respiratory effort normal. Cardiovascular system: S1 & S2 heard, RRR. No JVD, murmurs, rubs, gallops or clicks. No pedal edema. Gastrointestinal system: Abdomen is nondistended, soft and nontender. No organomegaly or masses felt. Normal bowel sounds heard. Central nervous system: Alert and oriented x2. No focal neurological deficits. Extremities: Symmetric 5 x 5 power. Skin: No rashes, lesions or ulcers Psychiatry:  Mood & affect appropriate.     Data Reviewed: I have personally reviewed following labs and imaging studies  CBC: Recent Labs  Lab 03/25/20 2026 03/26/20 0109 03/26/20 0432 03/27/20 0444  WBC 5.7 4.3 4.4 4.3  NEUTROABS  --   --   --  3.4  HGB 9.3* 8.8* 9.4* 9.5*  HCT 27.4* 26.2* 27.4* 26.9*  MCV 99.6 100.0 98.2 96.4  PLT 106* 81* 87* 94*   Basic Metabolic Panel: Recent Labs  Lab 03/25/20 2026 03/26/20 0109 03/26/20 0432 03/27/20 0444  NA 140  --  140 139  K 2.7*  --  3.0* 3.4*  CL 112*  --  113* 115*  CO2 18*  --  19* 18*  GLUCOSE 94  --  93 107*  BUN 38*  --  40* 39*  CREATININE 1.98* 1.90* 1.96* 1.61*  CALCIUM 7.4*  --  7.3* 7.2*  MG  --   --   --  1.8   GFR: Estimated Creatinine Clearance: 34.4 mL/min (A) (by C-G formula based on SCr of 1.61 mg/dL (H)). Liver Function Tests: Recent Labs  Lab 03/26/20 0432  AST 49*  ALT 52*  ALKPHOS 122  BILITOT 1.0  PROT 4.7*  ALBUMIN 2.1*   No results for input(s): LIPASE, AMYLASE in the last 168 hours. No results for input(s): AMMONIA in the last 168 hours. Coagulation Profile: No results for input(s): INR, PROTIME in the last 168 hours. Cardiac Enzymes: No results for input(s): CKTOTAL, CKMB, CKMBINDEX, TROPONINI in the last 168 hours. BNP (last 3 results) No results for input(s): PROBNP in the last 8760 hours. HbA1C: No results for input(s): HGBA1C in the last 72 hours. CBG: No results for input(s): GLUCAP in the last 168  hours. Lipid Profile: No results for input(s): CHOL, HDL, LDLCALC, TRIG, CHOLHDL, LDLDIRECT in the last 72 hours. Thyroid Function Tests: No results for input(s): TSH, T4TOTAL, FREET4, T3FREE, THYROIDAB in the last 72 hours. Anemia Panel: No results for input(s): VITAMINB12, FOLATE, FERRITIN, TIBC, IRON, RETICCTPCT in the last 72 hours. Sepsis Labs: No results for input(s): PROCALCITON, LATICACIDVEN in the last 168 hours.  No results found for this or any previous visit (from the past 240 hour(s)).       Radiology Studies: DG Chest 2 View  Result Date: 03/25/2020 CLINICAL DATA:  Shortness of breath.  COVID positive. EXAM: CHEST - 2 VIEW COMPARISON:  01/19/2020 FINDINGS: There are subtle bilateral hazy airspace opacities with prominent interstitial lung markings.  There is no pneumothorax. The cardiomediastinal silhouette is unremarkable. There are trace bilateral pleural effusions. IMPRESSION: Findings consistent with an atypical infectious process. Trace bilateral pleural effusions. Electronically Signed   By: Katherine Mantle M.D.   On: 03/25/2020 20:55        Scheduled Meds:  amLODipine  5 mg Oral Daily   apixaban  5 mg Oral BID   atorvastatin  40 mg Oral QHS   dexamethasone (DECADRON) injection  8 mg Intravenous Q24H   donepezil  10 mg Oral QHS   doxazosin  1 mg Oral QHS   FLUoxetine  40 mg Oral Daily   QUEtiapine  12.5 mg Oral QHS   sodium bicarbonate  1,300 mg Oral BID   tamsulosin  0.8 mg Oral BH-q7a   traZODone  50 mg Oral QHS   Continuous Infusions:  sodium chloride     famotidine (PEPCID) IV       LOS: 2 days    Time spent: 32 minutes, more than half the time involved in direct patient care.    Marrion Coy, MD [facility]   To contact the attending provider between 7A-7P or the covering provider during after hours 7P-7A, please log into the web site [email or web address] and access using universal [place] password for that web site. If  you do not have the password, please call the hospital operator.  03/27/2020, 9:39 AM

## 2020-03-27 NOTE — Progress Notes (Addendum)
Upon entering room pt immediately irritable, put his meds in his mouth and then spit them out, states that he doesn't want any meds, MD made aware

## 2020-03-27 NOTE — Plan of Care (Signed)

## 2020-03-27 NOTE — Progress Notes (Signed)
OT Cancellation Note  Patient Details Name: Brett Lam MRN: [id number] DOB: November 09, 1945   Cancelled Treatment:    Reason Eval/Treat Not Completed: Patient declined, no reason specified. Order received, chart reviewed. Pt declined to participate in OT services, stating he only wanted to go home, did not want to engage in any other activity here.   Latina Craver, PhD, MS, OTR/L ascom [phone] 03/27/20, 4:24 PM

## 2020-03-27 NOTE — [place] FL2 (Signed)
[place] MEDICAID FL2 LEVEL OF CARE SCREENING TOOL     IDENTIFICATION  Patient Name: Brett Lam Birthdate: July 12, 1945 Sex: male Admission Date (Current Location): 03/25/2020  [place] and [place] Number:  Chiropodist and Address:  [facility], [address], [place], [place] [address]      Provider Number: [id number]  Attending Physician Name and Address:  Marrion Coy, MD  Relative Name and Phone Number:  Theodor Mustin- wife- [phone]    Current Level of Care: Hospital Recommended Level of Care: Skilled Nursing Facility Prior Approval Number:    Date Approved/Denied:   PASRR Number: [id number]  Discharge Plan: SNF    Current Diagnoses: Patient Active Problem List   Diagnosis Date Noted  . Pressure injury of skin 03/27/2020  . COVID-19 virus infection 03/25/2020  . AKI (acute kidney injury) (HCC) 03/25/2020  . Fracture of humerus, proximal, left, closed 09/21/2017  . DVT (deep vein thrombosis) in pregnancy 09/20/2017  . Acute deep vein thrombosis (DVT) of axillary vein of left upper extremity (HCC) 09/19/2017  . Hypokalemia 06/02/2017  . Diarrhea 06/24/2016  . Personal history of colonic polyps   . First degree hemorrhoids   . Chest tightness 09/28/2012  . Morbid obesity (HCC) 09/28/2012  . Sleep apnea 09/28/2012  . HTN (hypertension) 09/28/2012  . Hyperlipidemia 09/28/2012  . Essential (primary) hypertension 09/28/2012  . Familial multiple lipoprotein-type hyperlipidemia 09/28/2012    Orientation RESPIRATION BLADDER Height & Weight     Self, Place  Normal Incontinent Weight: 60.4 kg Height:  5\' 8"  (172.7 cm)  BEHAVIORAL SYMPTOMS/MOOD NEUROLOGICAL BOWEL NUTRITION STATUS      Continent Diet (Renal Diet fluid restriction )  AMBULATORY STATUS COMMUNICATION OF NEEDS Skin   Extensive Assist Verbally Skin abrasions, Bruising, PU Stage and Appropriate Care (skin abrasions and tears to both arms, pressure injury to  buttocks, cocyxx, hips) PU Stage 1 Dressing: Daily                     Personal Care Assistance Level of Assistance  Bathing, Feeding, Dressing Bathing Assistance: Limited assistance Feeding assistance: Limited assistance Dressing Assistance: Limited assistance     Functional Limitations Info  Hearing   Hearing Info: Impaired      SPECIAL CARE FACTORS FREQUENCY  PT (By licensed PT), OT (By licensed OT)     PT Frequency: 5 times per week OT Frequency: 5 times per week            Contractures Contractures Info: Not present    Additional Factors Info  Code Status, Allergies Code Status Info: DNR Allergies Info: Chlorpheniramine           Current Medications (03/27/2020):  This is the current hospital active medication list Current Facility-Administered Medications  Medication Dose Route Frequency Provider Last Rate Last Admin  . 0.9 %  sodium chloride infusion   Intravenous PRN 13/01/2020, MD      . acetaminophen (TYLENOL) tablet 650 mg  650 mg Oral Q6H PRN Marrion Coy, MD       Or  . acetaminophen (TYLENOL) suppository 650 mg  650 mg Rectal Q6H PRN Rometta Emery, Mohammad L, MD      . albuterol (VENTOLIN HFA) 108 (90 Base) MCG/ACT inhaler 2 puff  2 puff Inhalation Once PRN Mikeal Hawthorne, MD      . amLODipine (NORVASC) tablet 5 mg  5 mg Oral Daily Marrion Coy, MD   5 mg at 03/26/20 1816  . apixaban (  ELIQUIS) tablet 5 mg  5 mg Oral BID Marrion Coy, MD   5 mg at 03/26/20 2109  . atorvastatin (LIPITOR) tablet 40 mg  40 mg Oral QHS Marrion Coy, MD   40 mg at 03/26/20 1816  . calcium carbonate (dosed in mg elemental calcium) suspension 500 mg of elemental calcium  500 mg of elemental calcium Oral Q6H PRN Rometta Emery, MD      . camphor-menthol (SARNA) lotion 1 application  1 application Topical Q8H PRN Rometta Emery, MD       And  . hydrOXYzine (ATARAX/VISTARIL) tablet 25 mg  25 mg Oral Q8H PRN Earlie Lou L, MD      . diphenhydrAMINE (BENADRYL)  injection 50 mg  50 mg Intravenous Once PRN Marrion Coy, MD      . docusate sodium [facility]) enema 283 mg  1 enema Rectal PRN Rometta Emery, MD      . donepezil (ARICEPT) tablet 10 mg  10 mg Oral QHS Marrion Coy, MD   10 mg at 03/26/20 2108  . doxazosin (CARDURA) tablet 1 mg  1 mg Oral QHS Marrion Coy, MD   1 mg at 03/26/20 2110  . EPINEPHrine (EPI-PEN) injection 0.3 mg  0.3 mg Intramuscular Once PRN Marrion Coy, MD      . famotidine (PEPCID) IVPB 20 mg premix  20 mg Intravenous Once PRN Marrion Coy, MD      . feeding supplement (NEPRO CARB STEADY) liquid 237 mL  237 mL Oral TID PRN Rometta Emery, MD      . FLUoxetine (PROZAC) capsule 40 mg  40 mg Oral Daily Marrion Coy, MD   40 mg at 03/26/20 1816  . methylPREDNISolone sodium succinate (SOLU-MEDROL) 125 mg/2 mL injection 125 mg  125 mg Intravenous Once PRN Marrion Coy, MD      . ondansetron (ZOFRAN) tablet 4 mg  4 mg Oral Q6H PRN Rometta Emery, MD       Or  . ondansetron (ZOFRAN) injection 4 mg  4 mg Intravenous Q6H PRN Rometta Emery, MD      . oxyCODONE-acetaminophen (PERCOCET/ROXICET) 5-325 MG per tablet 1-2 tablet  1-2 tablet Oral Q6H PRN Marrion Coy, MD      . QUEtiapine (SEROQUEL) tablet 12.5 mg  12.5 mg Oral QHS Marrion Coy, MD      . sodium bicarbonate tablet 1,300 mg  1,300 mg Oral BID Marrion Coy, MD   1,300 mg at 03/26/20 2108  . sorbitol 70 % solution 30 mL  30 mL Oral PRN Earlie Lou L, MD      . tamsulosin (FLOMAX) capsule 0.8 mg  0.8 mg Oral Charolette Forward, Freda Munro, MD      . traZODone (DESYREL) tablet 50 mg  50 mg Oral QHS Marrion Coy, MD      . zolpidem [facility]) tablet 5 mg  5 mg Oral QHS PRN Rometta Emery, MD   5 mg at 03/26/20 2109     Discharge Medications: Please see discharge summary for a list of discharge medications.  Relevant Imaging Results:  Relevant Lab Results:   Additional Information SS# [id number]  Allayne Butcher, RN

## 2020-03-27 NOTE — Progress Notes (Signed)
Decreased appetite, poor PO intake today

## 2020-03-27 NOTE — Evaluation (Signed)
Physical Therapy Evaluation Patient Details Name: Brett Lam MRN: [id number] DOB: August 17, 1945 Today's Date: 03/27/2020   History of Present Illness  74 y.o. male with medical history significant of obstructive sleep apnea, hypertension, hyperlipidemia, GERD, elephantiasis, BPH, hard of hearing, dementia and osteoarthritis who was diagnosed with COVID-19.  Pt lives at home and is part of the [facility] program, apparently wife has been struggling to manage him at home.    Clinical Impression  Pt confused t/o the session but PT was able to convince him to at least participate with some mobility/theraputic activity.  He needed assist with mobility, especially with getting back into bed.  Some light assist with getting to standing and able to take a few steps but he had poor tolerance and did not tolerate standing very long at all.  HR remained in/near 80s the entire time and he did not show any excessive SOB/labored breathing, etc.  Pt will need STR at discharge, he seemed to believe that he would be going home; despite repeated conversations to the contrary his confusion made clarifying this impossible.     Follow Up Recommendations SNF;Supervision/Assistance - 24 hour    Equipment Recommendations  None recommended by PT    Recommendations for Other Services       Precautions / Restrictions Precautions Precautions: Fall Restrictions Weight Bearing Restrictions: No      Mobility  Bed Mobility Overal bed mobility: Needs Assistance Bed Mobility: Supine to Sit;Sit to Supine     Supine to sit: Min assist Sit to supine: Mod assist   General bed mobility comments: Pt did not need a lot of assist with getting to sitting; however he could not get LEs back into bed, started to get very frustrated and ultimately needed considerable assist to get LEs back into bed and to scoot up    Transfers Overall transfer level: Needs assistance Equipment used: Rolling walker (2 wheeled) Transfers: Sit  to/from Stand Sit to Stand: Min assist         General transfer comment: Pt struggled to initiate standing on his own, but with light assist and plenty of cuing/encouragement he did manage to attain upright  Ambulation/Gait Ambulation/Gait assistance: Min assist Gait Distance (Feet): 4 Feet Assistive device: Rolling walker (2 wheeled)       General Gait Details: Pt was able to take a few steps forward and back with heavy UE use on walker and close supervision and a lot of cuing/encouragement.  No LOBs but quick to fatigue and requests/demands to sit back down quite abruptly.  Stairs            Wheelchair Mobility    Modified Rankin (Stroke Patients Only)       Balance Overall balance assessment: Needs assistance Sitting-balance support: No upper extremity supported Sitting balance-Leahy Scale: Good Sitting balance - Comments: Pt able to maintain sitting balance at EOB w/o assist     Standing balance-Leahy Scale: Fair Standing balance comment: reliant on walker, no buckling but definite unsteadiness and inconsistency with any movement                             Pertinent Vitals/Pain Pain Assessment: No/denies pain    Home Living Family/patient expects to be discharged to:: Unsure (pt mentions home, likely plan is STR) Living Arrangements: Spouse/significant other Available Help at Discharge: Family ([facility]ACE program member)             Additional Comments: pt  unable to give consistent information regarding PLOF/home    Prior Function           Comments: Pt reports that he can walk all he wants w/o AD, this seems highly unlikely.  Unable to gather accurate information     Hand Dominance        Extremity/Trunk Assessment   Upper Extremity Assessment Upper Extremity Assessment: Generalized weakness;Overall [facility] for tasks assessed    Lower Extremity Assessment Lower Extremity Assessment: Generalized weakness;Overall WFL for tasks  assessed       Communication   Communication: No difficulties  Cognition Arousal/Alertness: Awake/alert Behavior During Therapy: WFL for tasks assessed/performed Overall Cognitive Status: Within Functional Limits for tasks assessed                                        General Comments General comments (skin integrity, edema, etc.): Pt confused and not overly interested in doing a lot, needing regular cuing/encouragement to participate    Exercises     Assessment/Plan    PT Assessment Patient needs continued PT services  PT Problem List Decreased strength;Decreased range of motion;Decreased balance;Decreased activity tolerance;Decreased mobility;Decreased coordination;Decreased safety awareness;Decreased knowledge of use of DME;Decreased cognition       PT Treatment Interventions DME instruction;Gait training;Functional mobility training;Therapeutic activities;Therapeutic exercise;Balance training;Cognitive remediation;Neuromuscular re-education;Patient/family education    PT Goals (Current goals can be found in the Care Plan section)  Acute Rehab PT Goals Patient Stated Goal: get out of here PT Goal Formulation: With patient Time For Goal Achievement: 04/10/20 Potential to Achieve Goals: Fair    Frequency Min 2X/week   Barriers to discharge        Co-evaluation               AM-PAC PT "6 Clicks" Mobility  Outcome Measure Help needed turning from your back to your side while in a flat bed without using bedrails?: A Little Help needed moving from lying on your back to sitting on the side of a flat bed without using bedrails?: A Little Help needed moving to and from a bed to a chair (including a wheelchair)?: A Lot Help needed standing up from a chair using your arms (e.g., wheelchair or bedside chair)?: A Little Help needed to walk in hospital room?: A Lot Help needed climbing 3-5 steps with a railing? : Total 6 Click Score: 14    End of  Session   Activity Tolerance: Patient limited by fatigue (dementia/demeanor limiting participation/consistency) Patient left: with bed alarm set;with call bell/phone within reach Nurse Communication: Mobility status PT Visit Diagnosis: Muscle weakness (generalized) (M62.81);Difficulty in walking, not elsewhere classified (R26.2);Unsteadiness on feet (R26.81)    Time: 9292-4462 PT Time Calculation (min) (ACUTE ONLY): 28 min   Charges:   PT Evaluation $PT Eval Low Complexity: 1 Low PT Treatments $Therapeutic Activity: 8-22 mins        Malachi Pro, DPT 03/27/2020, 1:44 PM

## 2020-03-28 ENCOUNTER — Encounter: Payer: Self-pay | Admitting: Adult Health

## 2020-03-28 LAB — FOLATE: Folate: 2.6 ng/mL — ABNORMAL LOW (ref >5.9)

## 2020-03-28 MED ORDER — LACTATED RINGERS IV SOLN: INTRAVENOUS | Status: AC

## 2020-03-28 MED ORDER — POTASSIUM CHLORIDE CRYS ER 20 MEQ PO TBCR
40.0000 meq | EXTENDED_RELEASE_TABLET | Freq: Once | ORAL | Status: AC
  Administered 2020-03-28: 40 meq via ORAL
  Filled 2020-03-28: qty 2

## 2020-03-28 NOTE — Care Management Important Message (Deleted)
Important Message  Patient Details  Name: Brett Lam MRN: [id number] Date of Birth: August 20, 1945   Medicare Important Message Given:        Olegario Messier A Novak Stgermaine 03/28/2020, 8:26 AM

## 2020-03-28 NOTE — Progress Notes (Signed)
PROGRESS NOTE    Brett Lam  TFT:[id number] DOB: February 19, 1946 DOA: 03/25/2020 PCP: Kern Alberta, MD   Chief complaint. Generalized weakness. Brief Narrative:  Brett Angerer Russois a 74 y.o.malewith medical history significant ofobstructive sleep apnea, hypertension, hyperlipidemia, GERD, elephantiasis, BPH, hard of hearing and osteoarthritis who was diagnosed with COVID-19 about 5 days ago through the pace program.  I spoke with the patient and wife, patient has been falling at home.  He has general weakness. By the time he came to the hospital, patient found to have acute kidney failure with hypotension.  He was given fluids.  Chest x-ray showed atypical pneumonia consistent with Covid. Patient was placed on IV fluids, also received mnoantibody for Covid.   Assessment & Plan:   Principal Problem:   AKI (acute kidney injury) (HCC) Active Problems:   Sleep apnea   HTN (hypertension)   Hyperlipidemia   Essential (primary) hypertension   Hypokalemia   COVID-19 virus infection   Pressure injury of skin  #1. Covid pneumonia. Patient currently has no symptoms. Received mono antibiotics. Currently patient does not have any hypoxemia.  2. Acute kidney injury on chronic kidney disease stage II.  # metabolic acidosis. --Cr 1.98 on presentation.  Baseline around 1.1.  Cr improved with IVF. PLAN: --LR@50  for 20 hours due to poor oral intake and still elevated Cr. --cont sodium bicarb  Hypokalemia  --replete with oral potassium PRN  #3. Failure to thrive. Continue physical therapy and Occupational Therapy.  4. Obstructive sleep apnea. Continue CPAP while sleeping  4. Thrombocytopenia and anemia. --not def in iron or Vit B12  5. History of  DVT. Reviewed all previous charts, looks like a he had a provoked DVT more than a year ago, Eliquis has completed, change anticoagulation to prophylactic Lovenox.  6. Right bundle branch block.  7. Weight loss. Per patient PCP,  patient has been losing weight. Will refer outpatient to GI for EGD and colonoscopy after discharge.  Per patient's PCP Dr. Providence Lanius, patient has been deemed to be incompetent to make his own decisions. He has significant dementia and memory issues. The wife also has some memory issue, but currently is Management consultant. Patient has been falling at home, will need nursing home placement for rehab.   DVT prophylaxis: Lovenox Code Status: DNR Family Communication:  Disposition Plan:   Status is: Inpatient  Remains inpatient appropriate because:Inpatient level of care appropriate due to severity of illness   Dispo: The patient is from: Home              Anticipated d/c is to: SNF              Anticipated d/c date is: Monday Nov 15 (when the SNF can accept a covid pt)              Patient currently is medically stable to d/c.     I/O last 3 completed shifts: In: 250 [P.O.:250] Out: 550 [Urine:550] No intake/output data recorded.     Consultants:   None  Procedures: None  Antimicrobials: None  Subjective: Pt had no insight, knew he was in the hospital, but kept asking for his wife and saying he wanted to go home.  Not able to answer any other questions.   Objective: Vitals:   03/28/20 0038 03/28/20 0630 03/28/20 0700 03/28/20 1100  BP: 118/74 110/68 121/81 103/69  Pulse: (!) 50 61 68 71  Resp: 17 13 20    Temp: 98.5 F (36.9 C) 98.2 F (36.8  C) 97.8 F (36.6 C) 97.8 F (36.6 C)  TempSrc:   Axillary Axillary  SpO2: 94% 95% 97% 96%  Weight:      Height:        Intake/Output Summary (Last 24 hours) at 03/28/2020 1535 Last data filed at 03/28/2020 0531 Gross per 24 hour  Intake 50 ml  Output 550 ml  Net -500 ml   Filed Weights   03/25/20 2017 03/26/20 0108  Weight: 81.2 kg 60.4 kg    Examination:  Constitutional: NAD, alert, oriented to self and hospital, no insight  HEENT: conjunctivae and lids normal, EOMI CV: No cyanosis.   RESP: normal respiratory  effort, on RA Extremities: 2+ pitting edema in BLE SKIN: warm, dry and intact Neuro: II - XII grossly intact.      Data Reviewed: I have personally reviewed following labs and imaging studies  CBC: Recent Labs  Lab 03/25/20 2026 03/26/20 0109 03/26/20 0432 03/27/20 0444  WBC 5.7 4.3 4.4 4.3  NEUTROABS  --   --   --  3.4  HGB 9.3* 8.8* 9.4* 9.5*  HCT 27.4* 26.2* 27.4* 26.9*  MCV 99.6 100.0 98.2 96.4  PLT 106* 81* 87* 94*   Basic Metabolic Panel: Recent Labs  Lab 03/25/20 2026 03/26/20 0109 03/26/20 0432 03/27/20 0444  NA 140  --  140 139  K 2.7*  --  3.0* 3.4*  CL 112*  --  113* 115*  CO2 18*  --  19* 18*  GLUCOSE 94  --  93 107*  BUN 38*  --  40* 39*  CREATININE 1.98* 1.90* 1.96* 1.61*  CALCIUM 7.4*  --  7.3* 7.2*  MG  --   --   --  1.8   GFR: Estimated Creatinine Clearance: 34.4 mL/min (A) (by C-G formula based on SCr of 1.61 mg/dL (H)). Liver Function Tests: Recent Labs  Lab 03/26/20 0432  AST 49*  ALT 52*  ALKPHOS 122  BILITOT 1.0  PROT 4.7*  ALBUMIN 2.1*   No results for input(s): LIPASE, AMYLASE in the last 168 hours. No results for input(s): AMMONIA in the last 168 hours. Coagulation Profile: No results for input(s): INR, PROTIME in the last 168 hours. Cardiac Enzymes: No results for input(s): CKTOTAL, CKMB, CKMBINDEX, TROPONINI in the last 168 hours. BNP (last 3 results) No results for input(s): PROBNP in the last 8760 hours. HbA1C: No results for input(s): HGBA1C in the last 72 hours. CBG: No results for input(s): GLUCAP in the last 168 hours. Lipid Profile: No results for input(s): CHOL, HDL, LDLCALC, TRIG, CHOLHDL, LDLDIRECT in the last 72 hours. Thyroid Function Tests: No results for input(s): TSH, T4TOTAL, FREET4, T3FREE, THYROIDAB in the last 72 hours. Anemia Panel: Recent Labs    03/27/20 0444  VITAMINB12 919*  TIBC NOT CALCULATED  IRON 75   Sepsis Labs: No results for input(s): PROCALCITON, LATICACIDVEN in the last 168  hours.  No results found for this or any previous visit (from the past 240 hour(s)).       Radiology Studies: No results found.      Scheduled Meds:  amLODipine  5 mg Oral Daily   atorvastatin  40 mg Oral QHS   donepezil  10 mg Oral QHS   doxazosin  1 mg Oral QHS   enoxaparin (LOVENOX) injection  40 mg Subcutaneous Q24H   FLUoxetine  40 mg Oral Daily   QUEtiapine  12.5 mg Oral QHS   sodium bicarbonate  1,300 mg Oral BID   tamsulosin  0.8 mg Oral BH-q7a   traZODone  50 mg Oral QHS   Continuous Infusions:  sodium chloride     famotidine (PEPCID) IV     lactated ringers 50 mL/hr at 03/28/20 1107     LOS: 3 days     Darlin Priestly, MD [facility]   To contact the attending provider between 7A-7P or the covering provider during after hours 7P-7A, please log into the web site [email or web address] and access using universal [place] password for that web site. If you do not have the password, please call the hospital operator.  03/28/2020, 3:35 PM

## 2020-03-28 NOTE — TOC Progression Note (Signed)
Transition of Care [facility]) - Progression Note    Patient Details  Name: Brett Lam MRN: [id number] Date of Birth: May 30, 1945  [facility] CM/SW Contact  Allayne Butcher, RN Phone Number: 03/28/2020, 1:19 PM  Clinical Narrative:    RNCM spoke with Ricky over at [facility] in [place] about long term placement.  Clide Cliff says it shouldn't be a problem just needs to run it by Catering manager.  Patient tested positive for COVID and will need to be out 10 days before admission to [facility].    Expected Discharge Plan: Skilled Nursing Facility Barriers to Discharge: Continued Medical Work up  Expected Discharge Plan and Services Expected Discharge Plan: Skilled Nursing Facility   Discharge Planning Services: CM Consult Post Acute Care Choice: Skilled Nursing Facility Living arrangements for the past 2 months: Single Family Home                   DME Agency: NA       HH Arranged: NA           Social Determinants of Health (SDOH) Interventions    Readmission Risk Interventions No flowsheet data found.

## 2020-03-28 NOTE — Progress Notes (Signed)
Palliative:  Consult received d/t "family request". Chart reviewed and discussed with RN and Dr. Fran Lowes. Patient is stable - awaiting disposition. Patient enrolled in [facility]. Attempted to call wife to discuss goals of care however did not get an answer and unable to leave voicemail. Patient unable to discuss GOC d/t dementia. Wife may be given number to call palliative medicine team if she continues to ask for our involvement - [phone]  Gerlean Ren, DNP, [facility] Palliative Medicine Team Team Phone # [phone]  Pager # [phone]  NO CHARGE

## 2020-03-28 NOTE — Care Management Important Message (Signed)
Important Message  Patient Details  Name: Brett Lam MRN: [id number] Date of Birth: 06/17/1945   Medicare Important Message Given:  N/A - LOS <3 / Initial given by admissions     [name] A [name] 03/28/2020, 8:27 AM 

## 2020-03-28 NOTE — Care Management Important Message (Deleted)
Important Message  Patient Details  Name: Brett Lam MRN: [id number] Date of Birth: 02-08-1946   Medicare Important Message Given:  N/A - LOS <3 / Initial given by admissions     Brett Lam [name] 03/28/2020, 8:27 AM

## 2020-03-28 NOTE — Evaluation (Signed)
Occupational Therapy Evaluation Patient Details Name: Brett Lam MRN: [id number] DOB: 1945/10/29 Today's Date: 03/28/2020    History of Present Illness 74 y.o. male with medical history significant of obstructive sleep apnea, hypertension, hyperlipidemia, GERD, elephantiasis, BPH, hard of hearing, dementia and osteoarthritis who was diagnosed with COVID-19.  Pt lives at home and is part of the [facility] program, apparently wife has been struggling to manage him at home.     Clinical Impression   Pt was seen for OT evaluation this date. Prior to hospital admission, pt reports being INDEP with self care and fxl mobility with no AD (pt is questionable historian). Pt lives with spouse in [facility] with 3 STE per previous evaluations, unclear if this is still accurate. Currently pt demonstrates impairments as described below (See OT problem list) which functionally limit his ability to perform ADL/self-care tasks. Pt currently requires SETUP for bed level UB ADLs and MOD A for bed level LB ADLs. Pt declines to participate in fxl transfer trials at this time.  Pt would benefit from skilled OT services to address noted impairments and functional limitations (see below for any additional details) in order to maximize safety and independence while minimizing falls risk and caregiver burden. Upon hospital discharge, recommend STR to maximize pt safety and return to PLOF.     Follow Up Recommendations  SNF    Equipment Recommendations  Other (comment) (defer to next level of care)    Recommendations for Other Services       Precautions / Restrictions Precautions Precautions: Fall Restrictions Weight Bearing Restrictions: No      Mobility Bed Mobility               General bed mobility comments: Pt decilnes to get OOB with OT at this time    Transfers                 General transfer comment: declines to get OOB on OT assessment.    Balance       Sitting balance - Comments: NT        Standing balance comment: NT                           ADL either performed or assessed with clinical judgement   ADL                                         General ADL Comments: SETUP for bed level UB ADLs, MOD A for bed level LB ADLs. Pt declines to participate in ADL transfers at this time.     Vision Baseline Vision/History: Wears glasses Wears Glasses: At all times Patient Visual Report: No change from baseline       Perception     Praxis      Pertinent Vitals/Pain Pain Assessment: No/denies pain     Hand Dominance Right   Extremity/Trunk Assessment Upper Extremity Assessment Upper Extremity Assessment: Overall WFL for tasks assessed;Generalized weakness   Lower Extremity Assessment Lower Extremity Assessment: Overall WFL for tasks assessed;Generalized weakness       Communication Communication Communication: No difficulties   Cognition Arousal/Alertness: Awake/alert Behavior During Therapy: WFL for tasks assessed/performed Overall Cognitive Status: No family/caregiver present to determine baseline cognitive functioning  General Comments: Pt is able to follow simple one step commands, does require some increase processing time. Oriented to place and month/year, but not date or situation. He is somewhat stand-offish. Somewhat uninterested in participation. Perseverates on talking to his wife who OT attempts to call during session.   General Comments  pt not particularly motivated to participate. Somewhat appropriate/oriented, but overall stand-offish    Exercises Other Exercises Other Exercises: OT facilitates ed re: role of OT. Pt with limited reception/carryover.   Shoulder Instructions      Home Living Family/patient expects to be discharged to:: Private residence Living Arrangements: Spouse/significant other [facility]) Available Help at Discharge: Friend(s);Other  (Comment) ([facility] program member) Type of Home: House Home Access: Stairs to enter [employer] of Steps: 3 Entrance Stairs-Rails: Can reach both Home Layout: One level               Home Equipment: Walker - 2 wheels;Cane - single point   Additional Comments: pt unable to give consistent information regarding PLOF/home      Prior Functioning/Environment Level of Independence: Independent        Comments: Pt reports being INDEP at baseline and performing fxl mobility without AD, but states he has a walker and cane. Pt is questionable historian. Most home setup information gleaned from previous evaluations.        OT Problem List: Decreased strength;Decreased activity tolerance;Impaired balance (sitting and/or standing);Decreased cognition;Decreased safety awareness;Cardiopulmonary status limiting activity      OT Treatment/Interventions: Self-care/ADL training;DME and/or AE instruction;Therapeutic activities;Balance training;Therapeutic exercise;Energy conservation;Patient/family education    OT Goals(Current goals can be found in the care plan section) Acute Rehab OT Goals Patient Stated Goal: get out of here OT Goal Formulation: With patient Time For Goal Achievement: 04/11/20 Potential to Achieve Goals: Good ADL Goals Pt Will Perform Upper Body Dressing: with modified independence;sitting Pt Will Perform Lower Body Dressing: sit to/from stand;with min assist (with AE PRN) Pt Will Transfer to Toilet: with min assist;stand pivot transfer;bedside commode Pt Will Perform Toileting - Clothing Manipulation and hygiene: with min assist;sit to/from stand Pt/caregiver will Perform Home Exercise Program: Increased strength;Both right and left upper extremity;With minimal assist  OT Frequency: Min 1X/week   Barriers to D/C:            Co-evaluation              AM-PAC OT "6 Clicks" Daily Activity     Outcome Measure Help from another person eating meals?:  None Help from another person taking care of personal grooming?: A Little Help from another person toileting, which includes using toliet, bedpan, or urinal?: A Lot Help from another person bathing (including washing, rinsing, drying)?: A Lot Help from another person to put on and taking off regular upper body clothing?: A Little Help from another person to put on and taking off regular lower body clothing?: A Lot 6 Click Score: 16   End of Session Nurse Communication: Other (comment) (pt not wanting to get OOB)  Activity Tolerance: Other (comment) (somehwat self-limiting) Patient left: in bed;with call bell/phone within reach;with bed alarm set  OT Visit Diagnosis: Unsteadiness on feet (R26.81);Muscle weakness (generalized) (M62.81)                Time: 7253-6644 OT Time Calculation (min): 38 min Charges:  OT General Charges $OT Visit: 1 Visit OT Evaluation $OT Eval Moderate Complexity: 1 Mod OT Treatments $Self Care/Home Management : 23-37 mins  Rejeana Brock, MS, OTR/L ascom [phone] 03/28/20, 4:23  PM

## 2020-03-29 LAB — CBC
HCT: 27.6 % — ABNORMAL LOW (ref 39.0–52.0)
Hemoglobin: 9.3 g/dL — ABNORMAL LOW (ref 13.0–17.0)
MCH: 33.9 pg (ref 26.0–34.0)
MCHC: 33.7 g/dL (ref 30.0–36.0)
MCV: 100.7 fL — ABNORMAL HIGH (ref 80.0–100.0)
Platelets: 87 10*3/uL — ABNORMAL LOW (ref 150–400)
RBC: 2.74 MIL/uL — ABNORMAL LOW (ref 4.22–5.81)
RDW: 13.1 % (ref 11.5–15.5)
WBC: 4.2 10*3/uL (ref 4.0–10.5)
nRBC: 0 % (ref 0.0–0.2)

## 2020-03-29 LAB — BASIC METABOLIC PANEL
Anion gap: 2 — ABNORMAL LOW (ref 5–15)
BUN: 37 mg/dL — ABNORMAL HIGH (ref 8–23)
CO2: 21 mmol/L — ABNORMAL LOW (ref 22–32)
Calcium: 7.7 mg/dL — ABNORMAL LOW (ref 8.9–10.3)
Chloride: 120 mmol/L — ABNORMAL HIGH (ref 98–111)
Creatinine, Ser: 1.46 mg/dL — ABNORMAL HIGH (ref 0.61–1.24)
GFR, Estimated: 50 mL/min — ABNORMAL LOW (ref >60)
Glucose, Bld: 56 mg/dL — ABNORMAL LOW (ref 70–99)
Potassium: 3.7 mmol/L (ref 3.5–5.1)
Sodium: 143 mmol/L (ref 135–145)

## 2020-03-29 LAB — MAGNESIUM: Magnesium: 1.8 mg/dL (ref 1.7–2.4)

## 2020-03-29 MED ORDER — FOLIC ACID 1 MG PO TABS
1.0000 mg | ORAL_TABLET | Freq: Every day | ORAL | Status: DC
  Administered 2020-03-29 – 2020-04-02 (×5): 1 mg via ORAL
  Filled 2020-03-29 (×5): qty 1

## 2020-03-29 MED ORDER — NEPRO/CARBSTEADY PO LIQD
237.0000 mL | Freq: Three times a day (TID) | ORAL | Status: DC
  Administered 2020-03-29 – 2020-03-30 (×2): 237 mL via ORAL

## 2020-03-29 NOTE — TOC Progression Note (Signed)
Transition of Care [facility]) - Progression Note    Patient Details  Name: Brett Lam MRN: [id number] Date of Birth: [birth year]  [facility]) CM/SW Contact  Allayne Butcher, RN Phone Number: 03/29/2020, 2:25 PM  Clinical Narrative:    [facility] has offered a bed and they can accept patient on Monday or Tuesday once his quarantine is over.    Expected Discharge Plan: Skilled Nursing Facility Barriers to Discharge: Continued Medical Work up  Expected Discharge Plan and Services Expected Discharge Plan: Skilled Nursing Facility   Discharge Planning Services: CM Consult Post Acute Care Choice: Skilled Nursing Facility Living arrangements for the past 2 months: Single Family Home                   DME Agency: NA       HH Arranged: NA           Social Determinants of Health (SDOH) Interventions    Readmission Risk Interventions No flowsheet data found.

## 2020-03-29 NOTE — Progress Notes (Signed)
Physical Therapy Treatment Patient Details Name: Brett Lam MRN: [id number] DOB: 03-14-1946 Today's Date: 03/29/2020    History of Present Illness 74 y.o. male with medical history significant of obstructive sleep apnea, hypertension, hyperlipidemia, GERD, elephantiasis, BPH, hard of hearing, dementia and osteoarthritis who was diagnosed with COVID-19.  Pt lives at home and is part of the [facility] program, apparently wife has been struggling to manage him at home.      PT Comments    Patient is making slow progress towards meeting PT goals. Patient agreeable to in bed strengthening exercises for LE and performed with AAROM and cues for technique. No shortness of breath noted with exercises. Assistance required for bed mobility with good sitting balance demonstrated. Patient becomes more agitated with further mobility efforts and requested to return to supine position after sitting up briefly. Recommend to continue PT to maximize function and safety to facilitate independence. SNF remains appropriate discharge recommendation at this time.     Follow Up Recommendations  SNF;Supervision/Assistance - 24 hour     Equipment Recommendations  None recommended by PT    Recommendations for Other Services       Precautions / Restrictions Precautions Precautions: Fall    Mobility  Bed Mobility Overal bed mobility: Needs Assistance Bed Mobility: Supine to Sit;Sit to Supine     Supine to sit: Min assist Sit to supine: Mod assist   General bed mobility comments: Assistance required for trunk to sit up on edge of bed and for BLE support to return to bed. Verbal cues for task initiation and sequencing   Transfers                 General transfer comment: Patient refused to stand and becomes agitated with request for further mobility. Transfers not assessed at this time   Ambulation/Gait                 Stairs             Wheelchair Mobility    Modified Rankin  (Stroke Patients Only)       Balance   Sitting-balance support: No upper extremity supported;Feet supported Sitting balance-Leahy Scale: Good Sitting balance - Comments: no loss of balance in sitting position                                     Cognition Arousal/Alertness: Awake/alert Behavior During Therapy: Agitated Overall Cognitive Status: No family/caregiver present to determine baseline cognitive functioning                                 General Comments: Patient needs increased time for following commands. Patient is agitated at times and asking why he is unable to go home. Patient needs redirection for participation       Exercises General Exercises - Lower Extremity Ankle Circles/Pumps: AROM;Strengthening;Both;Other reps (comment);Other (comment) (2 sets of 10 reps each ) Heel Slides: AAROM;Strengthening;Both;10 reps;Supine (2 sets of 10 reps each ) Hip ABduction/ADduction: AAROM;Strengthening;Both;Supine;Other (comment);Other reps (comment) (2 sets of 10 reps each ) Straight Leg Raises: AAROM;Strengthening;Both;Supine;Other reps (comment);Other (comment) (2 sets of 10 reps each ) Other Exercises Other Exercises: verbal and visual cues for exercise technique for strengthening. no shortness of breath is noted with LE exercises     General Comments        Pertinent Vitals/Pain Pain  Assessment: No/denies pain    Home Living                      Prior Function            PT Goals (current goals can now be found in the care plan section) Acute Rehab PT Goals Patient Stated Goal: to go home  PT Goal Formulation: With patient Time For Goal Achievement: 04/10/20 Potential to Achieve Goals: Fair Progress towards PT goals: Progressing toward goals    Frequency    Min 2X/week      PT Plan Current plan remains appropriate    Co-evaluation              AM-PAC PT "6 Clicks" Mobility   Outcome Measure  Help  needed turning from your back to your side while in a flat bed without using bedrails?: A Little Help needed moving from lying on your back to sitting on the side of a flat bed without using bedrails?: A Little Help needed moving to and from a bed to a chair (including a wheelchair)?: A Lot Help needed standing up from a chair using your arms (e.g., wheelchair or bedside chair)?: A Little Help needed to walk in hospital room?: A Lot Help needed climbing 3-5 steps with a railing? : Total 6 Click Score: 14    End of Session   Activity Tolerance: Patient limited by fatigue (limited by willingness to particiapte with PT ) Patient left: in bed;with call bell/phone within reach;with bed alarm set Nurse Communication: Other (comment) (white board up to date with mobility status ) PT Visit Diagnosis: Muscle weakness (generalized) (M62.81);Difficulty in walking, not elsewhere classified (R26.2);Unsteadiness on feet (R26.81)     Time: 5537-4827 PT Time Calculation (min) (ACUTE ONLY): 23 min  Charges:  $Therapeutic Exercise: 23-37 mins                     Donna Bernard, PT, MPT  [employer] 03/29/2020, 2:26 PM

## 2020-03-29 NOTE — TOC Progression Note (Signed)
Transition of Care [facility]) - Progression Note    Patient Details  Name: Brett Lam MRN: [id number] Date of Birth: 04-17-1946  [facility]) CM/SW Contact  Allayne Butcher, RN Phone Number: 03/29/2020, 2:40 PM  Clinical Narrative:    Copy of the positive COVID test from 03/23/20 faxed over to [place] at [place] at [phone].  Patient can discharge to [facility] on Monday.    Expected Discharge Plan: Skilled Nursing Facility Barriers to Discharge: Continued Medical Work up  Expected Discharge Plan and Services Expected Discharge Plan: Skilled Nursing Facility   Discharge Planning Services: CM Consult Post Acute Care Choice: Skilled Nursing Facility Living arrangements for the past 2 months: Single Family Home                   DME Agency: NA       HH Arranged: NA           Social Determinants of Health (SDOH) Interventions    Readmission Risk Interventions No flowsheet data found.

## 2020-03-29 NOTE — Progress Notes (Signed)
PROGRESS NOTE    Brett Lam  NSQ:[id number] DOB: [birth year] DOA: 03/25/2020 PCP: Kern Alberta, MD   Chief complaint. Generalized weakness. Brief Narrative:  Brett Gulbrandsen Russois a 74 y.o.malewith medical history significant ofobstructive sleep apnea, hypertension, hyperlipidemia, GERD, elephantiasis, BPH, hard of hearing and osteoarthritis who was diagnosed with COVID-19 about 5 days ago through the pace program.  I spoke with the patient and wife, patient has been falling at home.  He has general weakness. By the time he came to the hospital, patient found to have acute kidney failure with hypotension.  He was given fluids.  Chest x-ray showed atypical pneumonia consistent with Covid. Patient was placed on IV fluids, also received mnoantibody for Covid.   Assessment & Plan:   Principal Problem:   AKI (acute kidney injury) (HCC) Active Problems:   Sleep apnea   HTN (hypertension)   Hyperlipidemia   Essential (primary) hypertension   Hypokalemia   COVID-19 virus infection   Pressure injury of skin  #1. Covid pneumonia. Patient currently has no symptoms. Received mono-antibodies. Currently patient does not have any hypoxemia.  2. Acute kidney injury on chronic kidney disease stage II.  # metabolic acidosis. --Cr 1.98 on presentation.  Baseline around 1.1.  Cr improved with IVF. PLAN: --cont sodium bicarb --Hold further MIVF  Hypokalemia  --replete PRN  Failure to thrive Poor oral intake and weight loss Baseline dementia --Per patient PCP, patient has been losing weight.  Nursing noted poor oral intake. --Palliative consult due to family request, however, palliative was not able to reach wife.  Pt is enrolled in PACE. --Monitor oral intake, may need to discuss feeding options.  4. Obstructive sleep apnea. Continue CPAP while sleeping  4. Thrombocytopenia and anemia. --not def in iron or Vit B12, but def in folate (2.6) --start oral folate suppl  5.  History of  DVT. Reviewed all previous charts, looks like a he had a provoked DVT more than a year ago, Eliquis has completed, change anticoagulation to prophylactic Lovenox.  6. Right bundle branch block.   Per patient's PCP Dr. Providence Lanius, patient has been deemed to be incompetent to make his own decisions. He has significant dementia and memory issues. The wife also has some memory issue, but currently is Management consultant. Patient has been falling at home, will need nursing home placement for rehab.   DVT prophylaxis: Lovenox Code Status: DNR Family Communication:  Disposition Plan:   Status is: Inpatient  Remains inpatient appropriate because:Inpatient level of care appropriate due to severity of illness   Dispo: The patient is from: Home              Anticipated d/c is to: SNF              Anticipated d/c date is: Monday Nov 15 (when the SNF can accept a covid pt)              Patient currently is medically stable to d/c.     I/O last 3 completed shifts: In: -  Out: 350 [Urine:350] No intake/output data recorded.     Consultants:   None  Procedures: None  Antimicrobials: None  Subjective: Nursing noted pt has poor oral intake, but will sip on fluids.    Objective: Vitals:   03/29/20 0500 03/29/20 0847 03/29/20 1158 03/29/20 1517  BP:  132/81 116/79 120/81  Pulse:  (!) 51 66 71  Resp:  18 18 17   Temp:  98 F (36.7 C) 98 F (36.7  C) 98 F (36.7 C)  TempSrc:  Oral  Oral  SpO2:  96% 98% 97%  Weight: 61 kg     Height:       No intake or output data in the 24 hours ending 03/29/20 1518 Filed Weights   03/25/20 2017 03/26/20 0108 03/29/20 0500  Weight: 81.2 kg 60.4 kg 61 kg    Examination:  Constitutional: NAD, alert, calm and cooperative today HEENT: conjunctivae and lids normal, EOMI CV: No cyanosis.   RESP: normal respiratory effort, on RA Extremities: No effusions, edema in BLE SKIN: warm, dry and intact Neuro: II - XII grossly intact.      Data Reviewed: I have personally reviewed following labs and imaging studies  CBC: Recent Labs  Lab 03/25/20 2026 03/26/20 0109 03/26/20 0432 03/27/20 0444 03/29/20 0602  WBC 5.7 4.3 4.4 4.3 4.2  NEUTROABS  --   --   --  3.4  --   HGB 9.3* 8.8* 9.4* 9.5* 9.3*  HCT 27.4* 26.2* 27.4* 26.9* 27.6*  MCV 99.6 100.0 98.2 96.4 100.7*  PLT 106* 81* 87* 94* 87*   Basic Metabolic Panel: Recent Labs  Lab 03/25/20 2026 03/26/20 0109 03/26/20 0432 03/27/20 0444 03/29/20 0602  NA 140  --  140 139 143  K 2.7*  --  3.0* 3.4* 3.7  CL 112*  --  113* 115* 120*  CO2 18*  --  19* 18* 21*  GLUCOSE 94  --  93 107* 56*  BUN 38*  --  40* 39* 37*  CREATININE 1.98* 1.90* 1.96* 1.61* 1.46*  CALCIUM 7.4*  --  7.3* 7.2* 7.7*  MG  --   --   --  1.8 1.8   GFR: Estimated Creatinine Clearance: 38.3 mL/min (A) (by C-G formula based on SCr of 1.46 mg/dL (H)). Liver Function Tests: Recent Labs  Lab 03/26/20 0432  AST 49*  ALT 52*  ALKPHOS 122  BILITOT 1.0  PROT 4.7*  ALBUMIN 2.1*   No results for input(s): LIPASE, AMYLASE in the last 168 hours. No results for input(s): AMMONIA in the last 168 hours. Coagulation Profile: No results for input(s): INR, PROTIME in the last 168 hours. Cardiac Enzymes: No results for input(s): CKTOTAL, CKMB, CKMBINDEX, TROPONINI in the last 168 hours. BNP (last 3 results) No results for input(s): PROBNP in the last 8760 hours. HbA1C: No results for input(s): HGBA1C in the last 72 hours. CBG: No results for input(s): GLUCAP in the last 168 hours. Lipid Profile: No results for input(s): CHOL, HDL, LDLCALC, TRIG, CHOLHDL, LDLDIRECT in the last 72 hours. Thyroid Function Tests: No results for input(s): TSH, T4TOTAL, FREET4, T3FREE, THYROIDAB in the last 72 hours. Anemia Panel: Recent Labs    03/27/20 0444  VITAMINB12 919*  FOLATE 2.6*  TIBC NOT CALCULATED  IRON 75   Sepsis Labs: No results for input(s): PROCALCITON, LATICACIDVEN in the last 168  hours.  No results found for this or any previous visit (from the past 240 hour(s)).       Radiology Studies: No results found.      Scheduled Meds: . amLODipine  5 mg Oral Daily  . atorvastatin  40 mg Oral QHS  . donepezil  10 mg Oral QHS  . doxazosin  1 mg Oral QHS  . enoxaparin (LOVENOX) injection  40 mg Subcutaneous Q24H  . feeding supplement (NEPRO CARB STEADY)  237 mL Oral TID BM  . FLUoxetine  40 mg Oral Daily  . folic acid  1 mg Oral  Daily  . QUEtiapine  12.5 mg Oral QHS  . sodium bicarbonate  1,300 mg Oral BID  . tamsulosin  0.8 mg Oral BH-q7a  . traZODone  50 mg Oral QHS   Continuous Infusions: . sodium chloride    . famotidine (PEPCID) IV       LOS: 4 days     Darlin Priestly, MD [facility]   To contact the attending provider between 7A-7P or the covering provider during after hours 7P-7A, please log into the web site [email or web address] and access using universal [place] password for that web site. If you do not have the password, please call the hospital operator.  03/29/2020, 3:18 PM

## 2020-03-29 NOTE — Plan of Care (Signed)
  Problem: Education: Goal: Knowledge of risk factors and measures for prevention of condition will improve Outcome: Progressing   Problem: Coping: Goal: Psychosocial and spiritual needs will be supported Outcome: Progressing   Problem: Respiratory: Goal: Will maintain a patent airway Outcome: Progressing Goal: Complications related to the disease process, condition or treatment will be avoided or minimized Outcome: Progressing   Problem: Education: Goal: Knowledge of General Education information will improve Description: Including pain rating scale, medication(s)/side effects and non-pharmacologic comfort measures Outcome: Progressing   Problem: Health Behavior/Discharge Planning: Goal: Ability to manage health-related needs will improve Outcome: Progressing   Problem: Clinical Measurements: Goal: Ability to maintain clinical measurements within normal limits will improve Outcome: Progressing Goal: Will remain free from infection Outcome: Progressing Goal: Diagnostic test results will improve Outcome: Progressing Goal: Respiratory complications will improve Outcome: Progressing Goal: Cardiovascular complication will be avoided Outcome: Progressing   Problem: Activity: Goal: Risk for activity intolerance will decrease Outcome: Progressing   Problem: Coping: Goal: Level of anxiety will decrease Outcome: Progressing   Problem: Nutrition: Goal: Adequate nutrition will be maintained Outcome: Progressing   Problem: Pain Managment: Goal: General experience of comfort will improve Outcome: Progressing   Problem: Safety: Goal: Ability to remain free from injury will improve Outcome: Progressing   Problem: Skin Integrity: Goal: Risk for impaired skin integrity will decrease Outcome: Progressing   Problem: Education: Goal: Knowledge of General Education information will improve Description: Including pain rating scale, medication(s)/side effects and  non-pharmacologic comfort measures Outcome: Progressing

## 2020-03-30 ENCOUNTER — Ambulatory Visit: Payer: Medicare (Managed Care)

## 2020-03-30 DIAGNOSIS — E43 Unspecified severe protein-calorie malnutrition: Secondary | ICD-10-CM | POA: Insufficient documentation

## 2020-03-30 DIAGNOSIS — Z66 Do not resuscitate: Secondary | ICD-10-CM

## 2020-03-30 DIAGNOSIS — R627 Adult failure to thrive: Secondary | ICD-10-CM

## 2020-03-30 DIAGNOSIS — Z7189 Other specified counseling: Secondary | ICD-10-CM

## 2020-03-30 DIAGNOSIS — Z515 Encounter for palliative care: Secondary | ICD-10-CM

## 2020-03-30 MED ORDER — ADULT MULTIVITAMIN W/MINERALS CH
1.0000 | ORAL_TABLET | Freq: Every day | ORAL | Status: DC
  Administered 2020-03-31 – 2020-04-02 (×3): 1 via ORAL
  Filled 2020-03-30 (×3): qty 1

## 2020-03-30 MED ORDER — ENSURE ENLIVE PO LIQD
237.0000 mL | Freq: Three times a day (TID) | ORAL | Status: DC
  Administered 2020-03-30 – 2020-04-02 (×5): 237 mL via ORAL

## 2020-03-30 NOTE — Progress Notes (Signed)
Occupational Therapy Treatment Patient Details Name: Brett Lam MRN: [id number] DOB: 08-05-1945 Today's Date: 03/30/2020    History of present illness 74 y.o. male with medical history significant of obstructive sleep apnea, hypertension, hyperlipidemia, GERD, elephantiasis, BPH, hard of hearing, dementia and osteoarthritis who was diagnosed with COVID-19.  Pt lives at home and is part of the [facility], apparently wife has been struggling to manage him at home.     OT comments  Pt seen for OT tx this date to f/u re: safety with ADLs/ADL mobility. OT faciltiates pt participation in bed level self care ADL-oral care. Extended time and gentle re-direction/tactile cues to get pt to particiapte in oral care at bed level with moderate assistance. Pt otherwise becomes agitated and very limited progress attained this session. RN notified. Pt left with all needs met and in reach, bed alarm on. Continue to recommend SNF as most appropriate d/c recommendation.   Follow Up Recommendations  SNF    Equipment Recommendations  Other (comment) (defer)    Recommendations for Other Services      Precautions / Restrictions Precautions Precautions: Fall Restrictions Weight Bearing Restrictions: No       Mobility Bed Mobility               General bed mobility comments: pt refuses  Transfers                 General transfer comment: pt refuses    Balance                                           ADL either performed or assessed with clinical judgement   ADL Overall ADL's : Needs assistance/impaired     Grooming: Oral care;Moderate assistance;Bed level Grooming Details (indicate cue type and reason): extensive time, cues, gentle re-direction to get pt to perform 1 ADL task. Pt perseverates on wondering where his wife is.                                     Vision Baseline Vision/History: Wears glasses Wears Glasses: At all  times Patient Visual Report: No change from baseline     Perception     Praxis      Cognition Arousal/Alertness: Awake/alert Behavior During Therapy: Agitated Overall Cognitive Status: No family/caregiver present to determine baseline cognitive functioning                                 General Comments: pt requires moderate gentle re-direction, he is able to be re-directed and cued enough to complete 1 g/h task with moderate assitance, but is otherwise becoming agitated and repeatedly asking "where's my wife? what the hell is going on? why am I here?"        Exercises Other Exercises Other Exercises: Extended time and gentle re-direction/tactile cues to get pt to particiapte in oral care at bed level with moderate assistance. Pt otherwise becomes agitated and very limited progress attained this session.   Shoulder Instructions       General Comments      Pertinent Vitals/ Pain       Pain Assessment: No/denies pain  Home Living  Prior Functioning/Environment              Frequency  Min 1X/week        Progress Toward Goals  OT Goals(current goals can now be found in the care plan section)  Progress towards OT goals: Not progressing toward goals - comment (pt complacent with goals with limited progress able to be made d/t his cognitive level and general demanor. Increased time required to complete 1 task to prevent agitation and re-direct.)  Acute Rehab OT Goals Patient Stated Goal: to go home  OT Goal Formulation: With patient Time For Goal Achievement: 04/11/20 Potential to Achieve Goals: [place] Discharge plan remains appropriate    Co-evaluation                 AM-PAC OT "6 Clicks" Daily Activity     Outcome Measure   Help from another person eating meals?: A Little Help from another person taking care of personal grooming?: A Lot Help from another person toileting,  which includes using toliet, bedpan, or urinal?: A Lot Help from another person bathing (including washing, rinsing, drying)?: A Lot Help from another person to put on and taking off regular upper body clothing?: A Lot Help from another person to put on and taking off regular lower body clothing?: A Lot 6 Click Score: 13    End of Session    OT Visit Diagnosis: Unsteadiness on feet (R26.81);Muscle weakness (generalized) (M62.81)   Activity Tolerance Other (comment) (self-limiting)   Patient Left in bed;with call bell/phone within reach;with bed alarm set   Nurse Communication Other (comment) (notified pt aggitated)        Time: 8177-1165 OT Time Calculation (min): 49 min  Charges: OT General Charges $OT Visit: 1 Visit OT Treatments $Self Care/Home Management : 38-52 mins  Gerrianne Scale, [place], OTR/L ascom [phone] 03/30/20, 4:56 PM

## 2020-03-30 NOTE — Care Management Important Message (Signed)
Important Message  Patient Details  Name: ANGELGABRIEL WILLMORE MRN: [id number] Date of Birth: [birth year]   Medicare Important Message Given:  Yes  IM left for RN to deliver due to isolation status.   Hadja Harral E Mardelle Pandolfi, LCSW 03/30/2020, 12:29 PM

## 2020-03-30 NOTE — Progress Notes (Signed)
Initial Nutrition Assessment  DOCUMENTATION CODES:   Severe malnutrition in context of chronic illness  INTERVENTION:  Recommend liberalizing diet to regular.  RD will discontinue Nepro.  Provide Ensure Enlive po TID between meals, each supplement provides 350 kcal and 20 grams of protein.  Provide Magic cup TID with meals, each supplement provides 290 kcal and 9 grams of protein.  Provide MVI po daily.  Continue folic acid 1 mg daily PO in setting of folate deficiency.  In setting of hx of duodenal switch and severe chronic malnutrition, patient is at risk for multiple vitamin/mineral deficiencies. Recommend checking thiamine, vitamin C, vitamin D, calcium, vitamin A, vitamin E, vitamin K, zinc, copper.  Plan is for MD to discuss patient's poor PO intake with family. If plan is for placement of small-bore feeding tube and initiation of tube feeds recommend: -Initiate Osmolite 1.5 Cal at 15 mL/hr and advance by 20 mL/hr every 12 hours to goal rate of 55 mL/hr per tube -Provide PROsource TF 45 mL BID per tube  Patient is at risk for refeeding syndrome. Recommend monitoring potassium, phosphorus, and magnesium at least daily, MD to replace as needed. If plan is for initiation of tube feeds recommend measuring baseline labs prior to initiation and every 12 hours until tube feeds are at goal rate and electrolytes are stable.  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   Met with patient at bedside. Patient is a poor historian in setting of dementia. Patient continues to ask when he can go home and where his wife is. Difficult to redirect. He does endorse that he is not hungry. He denies any difficulty with chewing/swallowing. Patient unable to answer any other questions.  Per review of chart patient only ate bites/sips at all of his meals on 11/9. Per RN patient also did the same on 11/10 and 11/11 and would not eat. This morning he spit food out of his mouth and refused to eat breakfast. Lunch tray was untouched at time of RD assessment. There was an Ensure on the table and patient had drank about 50% of it at that time. He did ask for RD to pour his soda into a cup for him and he took a few sips while RD in room.  Patient unable to provide any details on weight history or UBW. Per review of chart patient was 74.8 kg on 04/29/2019. He is currently 58.6 kg (129.19 lbs). He has lost 16.2 kg (21.7% body weight) over the past 11 months, which is significant for time frame.  Attempted to call patient's wife Jawon Dipiero over the phone to obtain more history but she was unable to answer. There was no option to leave a voicemail to request a return call.  Medications reviewed and include: folic acid 1 mg daily, sodium bicarbonate 1300 mg BID.  Labs reviewed: Chloride 120, CO2 21, BUN 37, Creatinine  1.46. On 11/9 Folate 2.6 (L), Iron 75 (WNL), and vitamin B12 919 (H).  Discussed with RN and MD in secure chat. Originally plan was for calorie count but plan is now to discontinue calorie count as PO intake is so poor. Plan is for MD to discuss with family regarding goals of care/nutrition support.  NUTRITION - FOCUSED PHYSICAL EXAM:    Most Recent Value  Orbital Region Severe depletion  Upper Arm Region Severe depletion  Thoracic and Lumbar  Region Severe depletion  Buccal Region Severe depletion  Temple Region Severe depletion  Clavicle Bone Region Severe depletion  Clavicle and Acromion Bone Region Severe depletion  Scapular Bone Region Severe depletion  Dorsal Hand Severe depletion  Patellar Region Severe depletion  Anterior Thigh Region Severe depletion  Posterior Calf Region Severe depletion  Edema (RD Assessment) Mild  Hair Reviewed  Eyes Reviewed  Mouth Reviewed  Skin Reviewed  [ecchymosis]  Nails Reviewed     Diet Order:   Diet Order            Diet regular Room service appropriate? Yes; Fluid consistency: Thin  Diet effective now                EDUCATION NEEDS:   Not appropriate for education at this time  Skin:  Skin Assessment: Skin Integrity Issues: Skin Integrity Issues:: DTI, Stage I, Stage II, Other (Comment) DTI: coccyx Stage I: left buttocks Stage II: location of stage 2 pressure injury is not currently documented Other: multiple skin tears  Last BM:  03/30/2020 per chart  Height:   Ht Readings from Last 1 Encounters:  03/25/20 _0  (1.727 m)   Weight:   Wt Readings from Last 1 Encounters:  03/30/20 58.6 kg   Ideal Body Weight:  70 kg  BMI:  Body mass index is 19.64 kg/m.  Estimated Nutritional Needs:   Kcal:  2230-0979  Protein:  95-105 grams  Fluid:  1.8-2 L/day  Jacklynn Barnacle, MS, RD, LDN Pager number available on Amion

## 2020-03-30 NOTE — Progress Notes (Signed)
PROGRESS NOTE    Brett Lam  UXN:[id number] DOB: April 24, 1946 DOA: 03/25/2020 PCP: Kern Alberta, MD   Chief complaint. Generalized weakness. Brief Narrative:  Brett Frasier Russois a 74 y.o.malewith medical history significant ofobstructive sleep apnea, hypertension, hyperlipidemia, GERD, elephantiasis, BPH, hard of hearing and osteoarthritis who was diagnosed with COVID-19 about 5 days ago through the [facility] program.  I spoke with the patient and Brett Lam, patient has been falling at home.  He has general weakness. By the time he came to the hospital, patient found to have acute kidney failure with hypotension.  He was given fluids.  Chest x-ray showed atypical pneumonia consistent with Covid. Patient was placed on IV fluids, also received mnoantibody for Covid.   Assessment & Plan:   Principal Problem:   AKI (acute kidney injury) (HCC) Active Problems:   Sleep apnea   HTN (hypertension)   Hyperlipidemia   Essential (primary) hypertension   Hypokalemia   COVID-19 virus infection   Pressure injury of skin   Protein-calorie malnutrition, severe  #1. Covid-19 infection. Patient currently has no symptoms. Received mono-antibodies. Currently patient does not have any hypoxemia.  2. Acute kidney injury on chronic kidney disease stage II.  # metabolic acidosis. --Cr 1.98 on presentation.  Baseline around 1.1.  Cr improved with IVF. PLAN: --cont sodium bicarb --Hold further MIVF  Hypokalemia  --replete PRN  Failure to thrive Poor oral intake and weight loss Severe malnutrition in context of chronic illness --Per patient PCP, patient has been losing weight.  Nursing noted poor oral intake. --Palliative consult due to family request, however, palliative was not able to reach Brett Lam.  Pt is enrolled in [facility]. --Per palliative conversation with pt's PCP Dr. Providence Lanius, "Patient has a MOST form. This form indicates patient would NOT want a feeding tube." PLAN: --liberalizing diet to  regular --No artifical feeding --After discharge, Dr. Providence Lanius will resume care at [facility] and likely proceed with transition to comfort focused measures at [facility].   Baseline dementia Anxiety --cont donepezil, proazc, seroquel and trazodone  4. Obstructive sleep apnea. Continue CPAP while sleeping  4. Thrombocytopenia and anemia. --not def in iron or Vit B12, but def in folate (2.6) --cont folate suppl  5. History of  DVT. Reviewed all previous charts, looks like a he had a provoked DVT more than a year ago, Eliquis has completed, change anticoagulation to prophylactic Lovenox.  6. Right bundle branch block.   Per patient's PCP Dr. Providence Lanius, patient has been deemed to be incompetent to make his own decisions. He has significant dementia and memory issues. The Brett Lam also has some memory issue, but currently is Management consultant. Patient has been falling at home, will need nursing home placement for rehab.   DVT prophylaxis: Lovenox Code Status: DNR Family Communication:  Disposition Plan:   Status is: Inpatient  Remains inpatient appropriate because:Inpatient level of care appropriate due to severity of illness   Dispo: The patient is from: Home              Anticipated d/c is to: SNF              Anticipated d/c date is: Monday Nov 15 (when the SNF can accept a covid pt)              Patient currently is medically stable to d/c.     I/O last 3 completed shifts: In: -  Out: 300 [Urine:300] No intake/output data recorded.     Consultants:   None  Procedures:  None  Antimicrobials: None  Subjective: Pt repeatedly yelled out "I want to go home!"    Nursing noted pt not eating.  Palliative provider contacted pt's PCP who confirmed that pt would not want tube feeding.  Brett Lam could not be reached.   Objective: Vitals:   03/30/20 0500 03/30/20 0629 03/30/20 0927 03/30/20 1211  BP:  123/82 127/77 108/76  Pulse:  (!) 57 (!) 58 85  Resp:  16 18 18   Temp:  97.6 F  (36.4 C) 98.4 F (36.9 C) 97.7 F (36.5 C)  TempSrc:   Oral Oral  SpO2:  97% 99% 99%  Weight: 58.6 kg     Height:        Intake/Output Summary (Last 24 hours) at 03/30/2020 1404 Last data filed at 03/29/2020 1709 Gross per 24 hour  Intake --  Output 300 ml  Net -300 ml   Filed Weights   03/26/20 0108 03/29/20 0500 03/30/20 0500  Weight: 60.4 kg 61 kg 58.6 kg    Examination:  Constitutional: NAD, alert HEENT: conjunctivae and lids normal, EOMI CV: No cyanosis.   RESP: normal respiratory effort, on RA Neuro: II - XII grossly intact.   Psych: agitated mood and affect.     Data Reviewed: I have personally reviewed following labs and imaging studies  CBC: Recent Labs  Lab 03/25/20 2026 03/26/20 0109 03/26/20 0432 03/27/20 0444 03/29/20 0602  WBC 5.7 4.3 4.4 4.3 4.2  NEUTROABS  --   --   --  3.4  --   HGB 9.3* 8.8* 9.4* 9.5* 9.3*  HCT 27.4* 26.2* 27.4* 26.9* 27.6*  MCV 99.6 100.0 98.2 96.4 100.7*  PLT 106* 81* 87* 94* 87*   Basic Metabolic Panel: Recent Labs  Lab 03/25/20 2026 03/26/20 0109 03/26/20 0432 03/27/20 0444 03/29/20 0602  NA 140  --  140 139 143  K 2.7*  --  3.0* 3.4* 3.7  CL 112*  --  113* 115* 120*  CO2 18*  --  19* 18* 21*  GLUCOSE 94  --  93 107* 56*  BUN 38*  --  40* 39* 37*  CREATININE 1.98* 1.90* 1.96* 1.61* 1.46*  CALCIUM 7.4*  --  7.3* 7.2* 7.7*  MG  --   --   --  1.8 1.8   GFR: Estimated Creatinine Clearance: 36.8 mL/min (A) (by C-G formula based on SCr of 1.46 mg/dL (H)). Liver Function Tests: Recent Labs  Lab 03/26/20 0432  AST 49*  ALT 52*  ALKPHOS 122  BILITOT 1.0  PROT 4.7*  ALBUMIN 2.1*   No results for input(s): LIPASE, AMYLASE in the last 168 hours. No results for input(s): AMMONIA in the last 168 hours. Coagulation Profile: No results for input(s): INR, PROTIME in the last 168 hours. Cardiac Enzymes: No results for input(s): CKTOTAL, CKMB, CKMBINDEX, TROPONINI in the last 168 hours. BNP (last 3 results) No  results for input(s): PROBNP in the last 8760 hours. HbA1C: No results for input(s): HGBA1C in the last 72 hours. CBG: No results for input(s): GLUCAP in the last 168 hours. Lipid Profile: No results for input(s): CHOL, HDL, LDLCALC, TRIG, CHOLHDL, LDLDIRECT in the last 72 hours. Thyroid Function Tests: No results for input(s): TSH, T4TOTAL, FREET4, T3FREE, THYROIDAB in the last 72 hours. Anemia Panel: No results for input(s): VITAMINB12, FOLATE, FERRITIN, TIBC, IRON, RETICCTPCT in the last 72 hours. Sepsis Labs: No results for input(s): PROCALCITON, LATICACIDVEN in the last 168 hours.  No results found for this or any previous visit (  from the past 240 hour(s)).       Radiology Studies: No results found.      Scheduled Meds: . amLODipine  5 mg Oral Daily  . atorvastatin  40 mg Oral QHS  . donepezil  10 mg Oral QHS  . doxazosin  1 mg Oral QHS  . enoxaparin (LOVENOX) injection  40 mg Subcutaneous Q24H  . feeding supplement  237 mL Oral TID BM  . FLUoxetine  40 mg Oral Daily  . folic acid  1 mg Oral Daily  . [START ON 03/31/2020] multivitamin with minerals  1 tablet Oral Daily  . QUEtiapine  12.5 mg Oral QHS  . sodium bicarbonate  1,300 mg Oral BID  . tamsulosin  0.8 mg Oral BH-q7a  . traZODone  50 mg Oral QHS   Continuous Infusions: . sodium chloride    . famotidine (PEPCID) IV       LOS: 5 days     Darlin Priestly, MD [facility]   To contact the attending provider between 7A-7P or the covering provider during after hours 7P-7A, please log into the web site [email or web address] and access using universal [place] password for that web site. If you do not have the password, please call the hospital operator.  03/30/2020, 2:04 PM

## 2020-03-30 NOTE — Progress Notes (Signed)
Patient continues to refuse all meals with occasional sips. MD and RD aware and addressing. Will continue to monitor.

## 2020-03-30 NOTE — Consult Note (Signed)
Consultation Note Date: 03/30/2020   Patient Name: Brett Lam  DOB: 04-04-1946  MRN: [id number]  Age / Sex: 74 y.o., male  PCP: Kern Alberta, MD Referring Physician: Darlin Priestly, MD  Reason for Consultation: Establishing goals of care  HPI/Patient Profile: 74 y.o. male  with past medical history of obstructive sleep apnea, hypertension, hyperlipidemia, GERD, elephantiasis, BPH, hard of hearing and osteoarthritis admitted on 03/25/2020 with weakness and FTT. Treated with IV fluids for AKI and hypotension.  Was diagnosed with COVID-19 prior to admission but has been asymptomatic, not requiring oxygen. Patient continues with poor PO intake and has had some episodes of hypoglycemia. PMT consulted for GOC.   Clinical Assessment and Goals of Care: Requested by Dr Fran Lowes to reengage d/t patient's failure to thrive. Reviewed chart and spoke with RN. Attempted to call patient's wife Clotilde Dieter - no answer and unable to leave voicemail.   I reached out to patients provider through [facility] - Dr. Layla Barter at [phone]  She shares that goals of care conversations have been had with patient and family in the past. Patient has a MOST form. This form indicates patient would NOT want a feeding tube.   Dr. Providence Lanius shares that patient has history of poor PO intake and hypoglycemia. He has a history of gastric bypass surgery and this has affected appetite. He also has had loss of taste long-term (not related to COVID) r/t dementia.   Dr. Providence Lanius agrees with plan to continue current measures but no escalation of care - no artificial feeding. Hopeful for discharge Monday and Dr. Providence Lanius will resume care at [facility] and likely proceed with transition to comfort focused measures at [facility].   Discussed plan of care with Dr. Fran Lowes. Dr. Providence Lanius will fax MOST form for hospital to have copy.   Primary Decision Maker NEXT OF KIN - wife Deforest Floerke    SUMMARY OF RECOMMENDATIONS    - no artificial feeding per [facility] physician and MOST form - plan to d/c to [facility] Monday and [facility] will explore comfort focused care with patient and family - continue current measures with no escalation of care  Code Status/Advance Care Planning:  DNR  Prognosis:   Weeks-Months likely, at risk for acute decompensation  Discharge Planning: SNF with [facility] - comfort measures outpatient      Primary Diagnoses: Present on Admission: . HTN (hypertension) . Sleep apnea . Hypokalemia . Hyperlipidemia . Essential (primary) hypertension . COVID-19 virus infection . AKI (acute kidney injury) (HCC)   I have reviewed the medical record, interviewed the patient and family, and examined the patient. The following aspects are pertinent.  Past Medical History:  Diagnosis Date  . Anxiety   . COVID-19 03/23/2020   result faxed to [facility], photo of result taken on 11/8 and available in Media tab of Chart Review  . Dizzy spells   . Elephantiasis   . Elevated PSA   . Enlarged prostate   . Genital herpes   . GERD (gastroesophageal reflux disease)    better since wt loss  . H/O flexible sigmoidoscopy 2002   rectal bleeding   . Hyperlipidemia   . Hypertension   . Osteoarthritis    better since wt loss  . Patient is Jehovah's Witness   . Rectal bleed   . Sleep apnea    cpap - does not need since wt loss  . Urinary frequency   . Venous stasis   . Wears dentures    partial upper and lower  Social History   Socioeconomic History  . Marital status: Married    Spouse name: Not on file  . Number of children: Not on file  . Years of education: Not on file  . Highest education level: Not on file  Occupational History  . Not on file  Tobacco Use  . Smoking status: Former Smoker    Packs/day: 1.00    Years: 6.00    Pack years: 6.00    Types: Cigarettes    Quit date: 01/26/1971    Years since quitting: 49.2  . Smokeless tobacco: Never Used  Substance and Sexual Activity  . Alcohol  use: No  . Drug use: No  . Sexual activity: Not on file  Other Topics Concern  . Not on file  Social History Narrative  . Not on file   Social Determinants of Health   Financial Resource Strain:   . Difficulty of Paying Living Expenses: Not on file  Food Insecurity:   . Worried About Programme researcher, broadcasting/film/video in the Last Year: Not on file  . Ran Out of Food in the Last Year: Not on file  Transportation Needs:   . Lack of Transportation (Medical): Not on file  . Lack of Transportation (Non-Medical): Not on file  Physical Activity:   . Days of Exercise per Week: Not on file  . Minutes of Exercise per Session: Not on file  Stress:   . Feeling of Stress : Not on file  Social Connections:   . Frequency of Communication with Friends and Family: Not on file  . Frequency of Social Gatherings with Friends and Family: Not on file  . Attends Religious Services: Not on file  . Active Member of Clubs or Organizations: Not on file  . Attends Banker Meetings: Not on file  . Marital Status: Not on file   Family History  Problem Relation Age of Onset  . Hypertension Other    Scheduled Meds: . amLODipine  5 mg Oral Daily  . atorvastatin  40 mg Oral QHS  . donepezil  10 mg Oral QHS  . doxazosin  1 mg Oral QHS  . enoxaparin (LOVENOX) injection  40 mg Subcutaneous Q24H  . feeding supplement  237 mL Oral TID BM  . FLUoxetine  40 mg Oral Daily  . folic acid  1 mg Oral Daily  . [START ON 03/31/2020] multivitamin with minerals  1 tablet Oral Daily  . QUEtiapine  12.5 mg Oral QHS  . sodium bicarbonate  1,300 mg Oral BID  . tamsulosin  0.8 mg Oral BH-q7a  . traZODone  50 mg Oral QHS   Continuous Infusions: . sodium chloride    . famotidine (PEPCID) IV     PRN Meds:.sodium chloride, acetaminophen **OR** acetaminophen, albuterol, calcium carbonate (dosed in mg elemental calcium), camphor-menthol **AND** hydrOXYzine, diphenhydrAMINE, docusate sodium, EPINEPHrine, famotidine (PEPCID)  IV, methylPREDNISolone (SOLU-MEDROL) injection, ondansetron **OR** ondansetron (ZOFRAN) IV, oxyCODONE-acetaminophen, sorbitol, zolpidem Allergies  Allergen Reactions  . Chlorpheniramine     Vital Signs: BP 131/76 (BP Location: Right Arm)   Pulse 60   Temp 98.4 F (36.9 C)   Resp 16   Ht 5\' 8"  (1.727 m)   Wt 58.6 kg   SpO2 97%   BMI 19.64 kg/m  Pain Scale: 0-10 POSS *See Group Information*: 1-Acceptable,Awake and alert Pain Score: 0-No pain   SpO2: SpO2: 97 % O2 Device:SpO2: 97 % O2 Flow Rate: .   IO: Intake/output summary:   Intake/Output Summary (Last 24  hours) at 03/30/2020 1604 Last data filed at 03/29/2020 1709 Gross per 24 hour  Intake --  Output 300 ml  Net -300 ml    LBM: Last BM Date: 03/30/20 Baseline Weight: Weight: 81.2 kg Most recent weight: Weight: 58.6 kg     Palliative Assessment/Data: PPS 40%    The above conversation was completed via telephone due to the visitor restrictions during the COVID-19 pandemic. Thorough chart review and discussion with necessary members of the care team was completed as part of assessment. All issues were discussed and addressed but no physical exam was performed.  Time Total: 60 minutes  Greater than 50%  of this time was spent counseling and coordinating care related to the above assessment and plan.  Gerlean Ren, DNP, AGNP-C Palliative Medicine Team [phone] Pager: [phone]

## 2020-03-30 NOTE — Plan of Care (Signed)
  Problem: Education: Goal: Knowledge of risk factors and measures for prevention of condition will improve Outcome: Progressing   Problem: Coping: Goal: Psychosocial and spiritual needs will be supported Outcome: Progressing   Problem: Respiratory: Goal: Will maintain a patent airway Outcome: Progressing Goal: Complications related to the disease process, condition or treatment will be avoided or minimized Outcome: Progressing   Problem: Education: Goal: Knowledge of General Education information will improve Description: Including pain rating scale, medication(s)/side effects and non-pharmacologic comfort measures Outcome: Progressing   Problem: Clinical Measurements: Goal: Ability to maintain clinical measurements within normal limits will improve Outcome: Progressing Goal: Will remain free from infection Outcome: Progressing Goal: Diagnostic test results will improve Outcome: Progressing Goal: Respiratory complications will improve Outcome: Progressing Goal: Cardiovascular complication will be avoided Outcome: Progressing   Problem: Activity: Goal: Risk for activity intolerance will decrease Outcome: Progressing   Problem: Nutrition: Goal: Adequate nutrition will be maintained Outcome: Progressing   Problem: Coping: Goal: Level of anxiety will decrease Outcome: Progressing   Problem: Elimination: Goal: Will not experience complications related to bowel motility Outcome: Progressing Goal: Will not experience complications related to urinary retention Outcome: Progressing   Problem: Pain Managment: Goal: General experience of comfort will improve Outcome: Progressing   Problem: Safety: Goal: Ability to remain free from injury will improve Outcome: Progressing   Problem: Skin Integrity: Goal: Risk for impaired skin integrity will decrease Outcome: Progressing   Problem: Education: Goal: Knowledge of General Education information will  improve Description: Including pain rating scale, medication(s)/side effects and non-pharmacologic comfort measures Outcome: Progressing

## 2020-03-31 LAB — CBC
HCT: 27.3 % — ABNORMAL LOW (ref 39.0–52.0)
Hemoglobin: 9.3 g/dL — ABNORMAL LOW (ref 13.0–17.0)
MCH: 34.1 pg — ABNORMAL HIGH (ref 26.0–34.0)
MCHC: 34.1 g/dL (ref 30.0–36.0)
MCV: 100 fL (ref 80.0–100.0)
Platelets: 107 10*3/uL — ABNORMAL LOW (ref 150–400)
RBC: 2.73 MIL/uL — ABNORMAL LOW (ref 4.22–5.81)
RDW: 12.9 % (ref 11.5–15.5)
WBC: 4.6 10*3/uL (ref 4.0–10.5)
nRBC: 0 % (ref 0.0–0.2)

## 2020-03-31 LAB — BASIC METABOLIC PANEL
Anion gap: 6 (ref 5–15)
BUN: 39 mg/dL — ABNORMAL HIGH (ref 8–23)
CO2: 22 mmol/L (ref 22–32)
Calcium: 7.6 mg/dL — ABNORMAL LOW (ref 8.9–10.3)
Chloride: 117 mmol/L — ABNORMAL HIGH (ref 98–111)
Creatinine, Ser: 1.3 mg/dL — ABNORMAL HIGH (ref 0.61–1.24)
GFR, Estimated: 58 mL/min — ABNORMAL LOW (ref >60)
Glucose, Bld: 64 mg/dL — ABNORMAL LOW (ref 70–99)
Potassium: 3.4 mmol/L — ABNORMAL LOW (ref 3.5–5.1)
Sodium: 145 mmol/L (ref 135–145)

## 2020-03-31 LAB — GLUCOSE, CAPILLARY
Glucose-Capillary: 58 mg/dL — ABNORMAL LOW (ref 70–99)
Glucose-Capillary: 72 mg/dL (ref 70–99)
Glucose-Capillary: 64 mg/dL — ABNORMAL LOW (ref 70–99)
Glucose-Capillary: 90 mg/dL (ref 70–99)

## 2020-03-31 LAB — MAGNESIUM: Magnesium: 1.9 mg/dL (ref 1.7–2.4)

## 2020-03-31 MED ORDER — QUETIAPINE FUMARATE 25 MG PO TABS
25.0000 mg | ORAL_TABLET | Freq: Every day | ORAL | Status: DC
  Administered 2020-03-31 – 2020-04-01 (×2): 25 mg via ORAL
  Filled 2020-03-31 (×2): qty 1

## 2020-03-31 MED ORDER — ORAL CARE MOUTH RINSE
15.0000 mL | Freq: Two times a day (BID) | OROMUCOSAL | Status: DC
  Administered 2020-04-01 – 2020-04-02 (×2): 15 mL via OROMUCOSAL

## 2020-03-31 NOTE — Progress Notes (Signed)
Hypoglycemic Event  CBG: 58  Treatment: OJ - see note below.   Symptoms: None  Follow-up CBG: Time:1004 CBG Result:72  Possible Reasons for Event: Patient refuses to eat. Spitted up his food.  Patient will drink OJ when offered.   Comments/MD notified: See note below  This writer noted around 0930 that patients' glucose level on CMP was 64.  Patient seemed asymptomatic. 4oz OJ given. At 0940 CBG was 58.  Additional 8oz OJ given.  CBG rechecked and was up to 72.  Patient refused to eat at this time. Dr. Fran Lowes made aware.  No new orders.  Per MD not to recheck CBG unless symptomatic.  Per MD to continue to offer PO fluids/meals.   Gaye Alken, RN-BSN.

## 2020-03-31 NOTE — Progress Notes (Addendum)
PROGRESS NOTE    Brett Lam  UDJ:[id number] DOB: May 19, 1946 DOA: 03/25/2020 PCP: Kern Alberta, MD   Chief complaint. Generalized weakness. Brief Narrative:  Brett Dayhoff Russois a 74 y.o.malewith medical history significant ofobstructive sleep apnea, hypertension, hyperlipidemia, GERD, elephantiasis, BPH, hard of hearing and osteoarthritis who was diagnosed with COVID-19 about 5 days ago through [facility].  I spoke with the patient and wife, patient has been falling at home.  He has general weakness. By the time he came to the hospital, patient found to have acute kidney failure with hypotension.  He was given fluids.  Chest x-ray showed atypical pneumonia consistent with Covid. Patient was placed on IV fluids, also received mnoantibody for Covid.   Assessment & Plan:   Principal Problem:   AKI (acute kidney injury) (HCC) Active Problems:   Sleep apnea   HTN (hypertension)   Hyperlipidemia   Essential (primary) hypertension   Hypokalemia   COVID-19 virus infection   Pressure injury of skin   Protein-calorie malnutrition, severe   Failure to thrive in adult   Goals of care, counseling/discussion   Palliative care by specialist   DNR (do not resuscitate)  #1. Covid-19 infection. Patient currently has no symptoms. Received mono-antibodies. Currently patient does not have any hypoxemia.  2. Acute kidney injury on chronic kidney disease stage II.  # metabolic acidosis. --Cr 1.98 on presentation.  Baseline around 1.1.  Cr improved with IVF. PLAN: --cont sodium bicarb --Hold further MIVF  Hypokalemia  --replete PRN  Failure to thrive Poor oral intake and weight loss Severe malnutrition in context of chronic illness --Per patient PCP, patient has been losing weight.  Nursing noted poor oral intake. --Palliative consult due to family request, however, palliative was not able to reach wife.  [facility] in [facility]. --Per palliative conversation with pt's PCP  Dr. Providence Lanius, "Patient has a MOST form. This form indicates patient would NOT want a feeding tube." PLAN: --liberalizing diet to regular --No artifical feeding --After discharge, Dr. Providence Lanius will resume care at [facility] and likely proceed with transition to comfort focused measures at [facility].   Hypoglycemia 2/2 poor oral intake --Per PCP, this has been a chronic problem.  Pt does not want artifical feeding. PLAN: --offer fluids when able --will not check BG unless symptomatic (per PCP, pt is transitioning to comfort measures going after discharge).  Baseline dementia Anxiety --cont donepezil, proazc, seroquel and trazodone  4. Obstructive sleep apnea. Continue CPAP while sleeping  4. Thrombocytopenia and anemia. --not def in iron or Vit B12, but def in folate (2.6) --cont folate suppl  5. History of  DVT. Reviewed all previous charts, looks like a he had a provoked DVT more than a year ago, Eliquis has completed, change anticoagulation to prophylactic Lovenox.  6. Right bundle branch block.   Per patient's PCP Dr. Providence Lanius, patient has been deemed to be incompetent to make his own decisions. He has significant dementia and memory issues. The wife also has some memory issue, but currently is Management consultant. Patient has been falling at home, will need nursing home placement for rehab.   DVT prophylaxis: Lovenox Code Status: DNR Family Communication:  Disposition Plan:   Status is: Inpatient  Remains inpatient appropriate because:Inpatient level of care appropriate due to severity of illness   Dispo: The patient is from: Home              Anticipated d/c is to: SNF  Anticipated d/c date is: Monday Nov 15 (when the SNF can accept a covid pt)              Patient currently is medically stable to d/c.     I/O last 3 completed shifts: In: 360 [P.O.:360] Out: -  No intake/output data recorded.     Consultants:   None  Procedures: None  Antimicrobials:  None  Subjective: Pt continued to have low BG, only taking sips, not eating.     Objective: Vitals:   03/31/20 0500 03/31/20 0525 03/31/20 0700 03/31/20 1100  BP:  138/81 (!) 142/81 113/81  Pulse:  (!) 53 (!) 55 73  Resp:  16 16 16   Temp:  97.7 F (36.5 C) 97.6 F (36.4 C) 97.7 F (36.5 C)  TempSrc:  Oral    SpO2:  98% 99% 98%  Weight: 58.5 kg     Height:        Intake/Output Summary (Last 24 hours) at 03/31/2020 1308 Last data filed at 03/30/2020 2200 Gross per 24 hour  Intake 360 ml  Output --  Net 360 ml   Filed Weights   03/29/20 0500 03/30/20 0500 03/31/20 0500  Weight: 61 kg 58.6 kg 58.5 kg    Examination:  Constitutional: NAD, alert, calm today HEENT: conjunctivae and lids normal, EOMI CV: No cyanosis.   RESP: normal respiratory effort, on RA Extremities: No effusions, edema in BLE SKIN: warm, dry and intact Neuro: II - XII grossly intact.      Data Reviewed: I have personally reviewed following labs and imaging studies  CBC: Recent Labs  Lab 03/26/20 0109 03/26/20 0432 03/27/20 0444 03/29/20 0602 03/31/20 0754  WBC 4.3 4.4 4.3 4.2 4.6  NEUTROABS  --   --  3.4  --   --   HGB 8.8* 9.4* 9.5* 9.3* 9.3*  HCT 26.2* 27.4* 26.9* 27.6* 27.3*  MCV 100.0 98.2 96.4 100.7* 100.0  PLT 81* 87* 94* 87* 107*   Basic Metabolic Panel: Recent Labs  Lab 03/25/20 2026 03/25/20 2026 03/26/20 0109 03/26/20 0432 03/27/20 0444 03/29/20 0602 03/31/20 0754  NA 140  --   --  140 139 143 145  K 2.7*  --   --  3.0* 3.4* 3.7 3.4*  CL 112*  --   --  113* 115* 120* 117*  CO2 18*  --   --  19* 18* 21* 22  GLUCOSE 94  --   --  93 107* 56* 64*  BUN 38*  --   --  40* 39* 37* 39*  CREATININE 1.98*   < > 1.90* 1.96* 1.61* 1.46* 1.30*  CALCIUM 7.4*  --   --  7.3* 7.2* 7.7* 7.6*  MG  --   --   --   --  1.8 1.8 1.9   < > = values in this interval not displayed.   GFR: Estimated Creatinine Clearance: 41.3 mL/min (A) (by C-G formula based on SCr of 1.3 mg/dL  (H)). Liver Function Tests: Recent Labs  Lab 03/26/20 0432  AST 49*  ALT 52*  ALKPHOS 122  BILITOT 1.0  PROT 4.7*  ALBUMIN 2.1*   No results for input(s): LIPASE, AMYLASE in the last 168 hours. No results for input(s): AMMONIA in the last 168 hours. Coagulation Profile: No results for input(s): INR, PROTIME in the last 168 hours. Cardiac Enzymes: No results for input(s): CKTOTAL, CKMB, CKMBINDEX, TROPONINI in the last 168 hours. BNP (last 3 results) No results for input(s): PROBNP in the  last 8760 hours. HbA1C: No results for input(s): HGBA1C in the last 72 hours. CBG: Recent Labs  Lab 03/31/20 0940 03/31/20 1004  GLUCAP 58* 72   Lipid Profile: No results for input(s): CHOL, HDL, LDLCALC, TRIG, CHOLHDL, LDLDIRECT in the last 72 hours. Thyroid Function Tests: No results for input(s): TSH, T4TOTAL, FREET4, T3FREE, THYROIDAB in the last 72 hours. Anemia Panel: No results for input(s): VITAMINB12, FOLATE, FERRITIN, TIBC, IRON, RETICCTPCT in the last 72 hours. Sepsis Labs: No results for input(s): PROCALCITON, LATICACIDVEN in the last 168 hours.  No results found for this or any previous visit (from the past 240 hour(s)).       Radiology Studies: No results found.      Scheduled Meds: . amLODipine  5 mg Oral Daily  . atorvastatin  40 mg Oral QHS  . donepezil  10 mg Oral QHS  . doxazosin  1 mg Oral QHS  . enoxaparin (LOVENOX) injection  40 mg Subcutaneous Q24H  . feeding supplement  237 mL Oral TID BM  . FLUoxetine  40 mg Oral Daily  . folic acid  1 mg Oral Daily  . multivitamin with minerals  1 tablet Oral Daily  . QUEtiapine  25 mg Oral QHS  . sodium bicarbonate  1,300 mg Oral BID  . tamsulosin  0.8 mg Oral BH-q7a  . traZODone  50 mg Oral QHS   Continuous Infusions: . sodium chloride    . famotidine (PEPCID) IV       LOS: 6 days     Darlin Priestly, MD [facility]   To contact the attending provider between 7A-7P or the covering provider  during after hours 7P-7A, please log into the web site [email or web address] and access using universal [place] password for that web site. If you do not have the password, please call the hospital operator.  03/31/2020, 1:08 PM

## 2020-04-01 NOTE — Progress Notes (Signed)
PROGRESS NOTE    Brett Lam  QAS:[id number] DOB: 1945-11-06 DOA: 03/25/2020 PCP: Kern Alberta, MD   Chief complaint. Generalized weakness. Brief Narrative:  Brett Lam a 74 y.o.malewith medical history significant ofobstructive sleep apnea, hypertension, hyperlipidemia, GERD, elephantiasis, BPH, hard of hearing and osteoarthritis who was diagnosed with COVID-19 about 5 days ago through the [facility] program.  I spoke with the patient and wife, patient has been falling at home.  He has general weakness. By the time he came to the hospital, patient found to have acute kidney failure with hypotension.  He was given fluids.  Chest x-ray showed atypical pneumonia consistent with Covid. Patient was placed on IV fluids, also received mnoantibody for Covid.   Assessment & Plan:   Principal Problem:   AKI (acute kidney injury) (HCC) Active Problems:   Sleep apnea   HTN (hypertension)   Hyperlipidemia   Essential (primary) hypertension   Hypokalemia   COVID-19 virus infection   Pressure injury of skin   Protein-calorie malnutrition, severe   Failure to thrive in adult   Goals of care, counseling/discussion   Palliative care by specialist   DNR (do not resuscitate)  #1. Covid-19 infection. Patient currently has no symptoms. Received mono-antibodies. Currently patient does not have any hypoxemia.  2. Acute kidney injury on chronic kidney disease stage II.  # metabolic acidosis. --Cr 1.98 on presentation.  Baseline around 1.1.  Cr improved with IVF. PLAN: --cont sodium bicarb --Hold further MIVF  Hypokalemia  --replete PRN  Failure to thrive Poor oral intake and weight loss Severe malnutrition in context of chronic illness --Per patient PCP, patient has been losing weight.  Nursing noted poor oral intake. --Palliative consult due to family request, however, palliative was not able to reach wife.  [facility]. --Per palliative conversation with pt's PCP  Dr. Providence Lanius, "Patient has a MOST form. This form indicates patient would NOT want a feeding tube." PLAN: --liberalizing diet to regular --No artifical feeding --After discharge, Dr. Providence Lanius will resume care at [facility] and likely proceed with transition to comfort focused measures at [facility].   Hypoglycemia 2/2 poor oral intake --Per PCP, this has been a chronic problem.  Pt does not want artifical feeding. PLAN: --offer fluids when able --will not check BG unless symptomatic (per PCP, pt is transitioning to comfort measures going after discharge).  Baseline dementia Anxiety --cont donepezil, proazc, seroquel and trazodone  4. Obstructive sleep apnea. Continue CPAP while sleeping  4. Thrombocytopenia and anemia. --not def in iron or Vit B12, but def in folate (2.6) --cont folate suppl  5. History of  DVT. Reviewed all previous charts, looks like a he had a provoked DVT more than a year ago, Eliquis has completed, change anticoagulation to prophylactic Lovenox.  6. Right bundle branch block.   Per patient's PCP Dr. Providence Lanius, patient has been deemed to be incompetent to make his own decisions. He has significant dementia and memory issues. The wife also has some memory issue, but currently is Management consultant. Patient has been falling at home, will need nursing home placement for rehab.   DVT prophylaxis: Lovenox Code Status: DNR Family Communication:  Disposition Plan:   Status is: Inpatient  Remains inpatient appropriate because:Inpatient level of care appropriate due to severity of illness   Dispo: The patient is from: Home              Anticipated d/c is to: SNF  Anticipated d/c date is: Monday Nov 15 (when the SNF can accept a covid pt)              Patient currently is medically stable to d/c.     I/O last 3 completed shifts: In: 360 [P.O.:360] Out: -  No intake/output data recorded.     Consultants:   None  Procedures: None  Antimicrobials:  None  Subjective: Will drink sips of fluids when offered, but spits out food.   Objective: Vitals:   04/01/20 0229 04/01/20 0500 04/01/20 0646 04/01/20 0941  BP: 138/76  137/85 (!) 141/83  Pulse: (!) 46  (!) 51 65  Resp: 16  16 17   Temp: 98.1 F (36.7 C)  (!) 97.5 F (36.4 C) (!) 97.5 F (36.4 C)  TempSrc: Oral  Oral Axillary  SpO2: 99%  99% 95%  Weight:  61.4 kg    Height:       No intake or output data in the 24 hours ending 04/01/20 1445 Filed Weights   03/30/20 0500 03/31/20 0500 04/01/20 0500  Weight: 58.6 kg 58.5 kg 61.4 kg    Examination:  Constitutional: NAD, alert, oriented to self and hospital, no insight HEENT: conjunctivae and lids normal, EOMI CV: No cyanosis.   RESP: normal respiratory effort, on RA Extremities: No effusions, edema in BLE SKIN: warm, dry and intact Neuro: II - XII grossly intact.      Data Reviewed: I have personally reviewed following labs and imaging studies  CBC: Recent Labs  Lab 03/26/20 0109 03/26/20 0432 03/27/20 0444 03/29/20 0602 03/31/20 0754  WBC 4.3 4.4 4.3 4.2 4.6  NEUTROABS  --   --  3.4  --   --   HGB 8.8* 9.4* 9.5* 9.3* 9.3*  HCT 26.2* 27.4* 26.9* 27.6* 27.3*  MCV 100.0 98.2 96.4 100.7* 100.0  PLT 81* 87* 94* 87* 107*   Basic Metabolic Panel: Recent Labs  Lab 03/25/20 2026 03/25/20 2026 03/26/20 0109 03/26/20 0432 03/27/20 0444 03/29/20 0602 03/31/20 0754  NA 140  --   --  140 139 143 145  K 2.7*  --   --  3.0* 3.4* 3.7 3.4*  CL 112*  --   --  113* 115* 120* 117*  CO2 18*  --   --  19* 18* 21* 22  GLUCOSE 94  --   --  93 107* 56* 64*  BUN 38*  --   --  40* 39* 37* 39*  CREATININE 1.98*   < > 1.90* 1.96* 1.61* 1.46* 1.30*  CALCIUM 7.4*  --   --  7.3* 7.2* 7.7* 7.6*  MG  --   --   --   --  1.8 1.8 1.9   < > = values in this interval not displayed.   GFR: Estimated Creatinine Clearance: 43.3 mL/min (A) (by C-G formula based on SCr of 1.3 mg/dL (H)). Liver Function Tests: Recent Labs  Lab  03/26/20 0432  AST 49*  ALT 52*  ALKPHOS 122  BILITOT 1.0  PROT 4.7*  ALBUMIN 2.1*   No results for input(s): LIPASE, AMYLASE in the last 168 hours. No results for input(s): AMMONIA in the last 168 hours. Coagulation Profile: No results for input(s): INR, PROTIME in the last 168 hours. Cardiac Enzymes: No results for input(s): CKTOTAL, CKMB, CKMBINDEX, TROPONINI in the last 168 hours. BNP (last 3 results) No results for input(s): PROBNP in the last 8760 hours. HbA1C: No results for input(s): HGBA1C in the last 72 hours. CBG:  Recent Labs  Lab 03/31/20 0940 03/31/20 1004 03/31/20 2041 03/31/20 2234  GLUCAP 58* 72 64* 90   Lipid Profile: No results for input(s): CHOL, HDL, LDLCALC, TRIG, CHOLHDL, LDLDIRECT in the last 72 hours. Thyroid Function Tests: No results for input(s): TSH, T4TOTAL, FREET4, T3FREE, THYROIDAB in the last 72 hours. Anemia Panel: No results for input(s): VITAMINB12, FOLATE, FERRITIN, TIBC, IRON, RETICCTPCT in the last 72 hours. Sepsis Labs: No results for input(s): PROCALCITON, LATICACIDVEN in the last 168 hours.  No results found for this or any previous visit (from the past 240 hour(s)).       Radiology Studies: No results found.      Scheduled Meds:  amLODipine  5 mg Oral Daily   atorvastatin  40 mg Oral QHS   donepezil  10 mg Oral QHS   doxazosin  1 mg Oral QHS   enoxaparin (LOVENOX) injection  40 mg Subcutaneous Q24H   feeding supplement  237 mL Oral TID BM   FLUoxetine  40 mg Oral Daily   folic acid  1 mg Oral Daily   mouth rinse  15 mL Mouth Rinse BID   multivitamin with minerals  1 tablet Oral Daily   QUEtiapine  25 mg Oral QHS   sodium bicarbonate  1,300 mg Oral BID   tamsulosin  0.8 mg Oral BH-q7a   traZODone  50 mg Oral QHS   Continuous Infusions:  sodium chloride     famotidine (PEPCID) IV       LOS: 7 days     Darlin Priestly, MD [facility]   To contact the attending provider between 7A-7P  or the covering provider during after hours 7P-7A, please log into the web site [email or web address] and access using universal Lane password for that web site. If you do not have the password, please call the hospital operator.  04/01/2020, 2:45 PM

## 2020-04-01 NOTE — Plan of Care (Signed)
  Problem: Coping: Goal: Psychosocial and spiritual needs will be supported Outcome: Progressing   Problem: Respiratory: Goal: Will maintain a patent airway Outcome: Progressing Goal: Complications related to the disease process, condition or treatment will be avoided or minimized Outcome: Progressing   Problem: Clinical Measurements: Goal: Ability to maintain clinical measurements within normal limits will improve Outcome: Progressing Goal: Will remain free from infection Outcome: Progressing Goal: Diagnostic test results will improve Outcome: Progressing Goal: Respiratory complications will improve Outcome: Progressing Goal: Cardiovascular complication will be avoided Outcome: Progressing   Problem: Activity: Goal: Risk for activity intolerance will decrease Outcome: Progressing   Problem: Coping: Goal: Level of anxiety will decrease Outcome: Progressing   Problem: Elimination: Goal: Will not experience complications related to bowel motility Outcome: Progressing Goal: Will not experience complications related to urinary retention Outcome: Progressing   Problem: Pain Managment: Goal: General experience of comfort will improve Outcome: Progressing   Problem: Safety: Goal: Ability to remain free from injury will improve Outcome: Progressing   Problem: Skin Integrity: Goal: Risk for impaired skin integrity will decrease Outcome: Progressing   Problem: Education: Goal: Knowledge of General Education information will improve Description: Including pain rating scale, medication(s)/side effects and non-pharmacologic comfort measures Outcome: Progressing

## 2020-04-02 MED ORDER — FOLIC ACID 1 MG PO TABS: 1.0000 mg | ORAL_TABLET | Freq: Every day | ORAL | Status: AC

## 2020-04-02 MED ORDER — QUETIAPINE FUMARATE 25 MG PO TABS: 25.0000 mg | ORAL_TABLET | Freq: Every day | ORAL | Status: AC

## 2020-04-02 MED ORDER — ENSURE ENLIVE PO LIQD: 237.0000 mL | Freq: Three times a day (TID) | ORAL | 12 refills | Status: AC

## 2020-04-02 MED ORDER — QUETIAPINE FUMARATE 25 MG PO TABS
25.0000 mg | ORAL_TABLET | Freq: Every day | ORAL | Status: DC
Start: 2020-04-02 — End: 2020-04-02

## 2020-04-02 NOTE — Care Management Important Message (Signed)
Important Message  Patient Details  Name: Brett Lam MRN: [id number] Date of Birth: 28-Mar-1946   Medicare Important Message Given:  Yes     Allayne Butcher, RN 04/02/2020, 11:39 AM

## 2020-04-02 NOTE — Progress Notes (Signed)
Incomplete/undated MOST form was rec'd from [facility], but only the front page, so it cannot be scanned into our medical records. It is signed by a provider (Dakar Howell, AGNP-C) and by his wife Tanvir Hipple), effective 03/27/20.   These are the elections made:  Section A: DNR Section B: Comfort Measures. Keep clean, warm and dry. Use medication by any route , positioning, wound care, and other measures to relive pain and suffering. Use O2, suction, & manual treatment of airway obstruction as needed for comfort. Do not transfer to hospital unless comfort needs cannot be met in current location.  Section C: Antibiotics if indicated. Section D: IV fluids if indicated. No feeding tube Section E: Discussed with spouse.  Marjie Skiff Nabeeha Badertscher, RN, BSN, [facility] Palliative Medicine Team 04/02/2020 10:31 AM Office [phone]

## 2020-04-02 NOTE — Progress Notes (Signed)
Report called to [facility], Faith LPN

## 2020-04-02 NOTE — Progress Notes (Signed)
Transport here at this time to transport pt, packet given to driver

## 2020-04-02 NOTE — Progress Notes (Signed)
Attempted to call [facility] to give report, nurse busy and will call me back

## 2020-04-02 NOTE — Progress Notes (Signed)
Pt being rude to staff, cursing at staff, pt reminded that this behavior is not appropriate, assisted pt with breakfast tray, pt remains irritable

## 2020-04-02 NOTE — TOC Transition Note (Signed)
Transition of Care [facility]) - CM/SW Discharge Note   Patient Details  Name: Brett Lam MRN: [id number] Date of Birth: Dec 30, 1945  Transition of Care [facility] CM/SW Contact:  Allayne Butcher, RN Phone Number: 04/02/2020, 11:06 AM   Clinical Narrative:    Patient will be going to [facility] in [place] today.  [facility] has been updated and will be picking the patient up at discharge and transporting him to the facility.  Patient is going to room F7 and bedside RN will call report to [phone].    Final next level of care: Skilled Nursing Facility Barriers to Discharge: Continued Medical Work up   Patient Goals and CMS Choice Patient states their goals for this hospitalization and ongoing recovery are:: Patient needs long term care per wife and [facility] CMS Medicare.gov Compare Post Acute Care list provided to:: Patient Represenative (must comment) Choice offered to / list presented to : Spouse  Discharge Placement              Patient chooses bed at: [facility] [place] Patient to be transferred to facility by: [facility] will transport Name of family member notified: Clotilde Dieter (wife)  Marylu Lund and Advertising account planner with [facility] Patient and family notified of of transfer: 04/02/20  Discharge Plan and Services   Discharge Planning Services: CM Consult Post Acute Care Choice: Skilled Nursing Facility            DME Agency: NA       HH Arranged: NA          Social Determinants of Health (SDOH) Interventions     Readmission Risk Interventions No flowsheet data found.

## 2020-04-02 NOTE — Discharge Summary (Signed)
Physician Discharge Summary   Brett Lam  male DOB: 1945/11/21  BMS:[id number]  PCP: Leticia Penna, MD  Admit date: 03/25/2020 Discharge date: 04/02/2020  Admitted From: home Disposition:  SNF CODE STATUS: DNR  Discharge Instructions    No wound care   Complete by: As directed        Hospital Course:  For full details, please see H&P, progress notes, consult notes and ancillary notes.  Briefly,  Brett Pilley Russois a 74 y.o.malewith medical history significant ofobstructive sleep apnea, hypertension, hyperlipidemia, GERD, elephantiasis, BPH, hard of hearing and osteoarthritis who was diagnosed with COVID-19 about 5 days prior to presentation through the [facility], who presented with falling at home and generalized weakness.  Covid-19 infection. Patient currently has no symptoms, no hypoxia.  Received mono-antibodies.  # Acute kidney injury on chronic kidney disease stage II.  # metabolic acidosis. Cr 1.98 on presentation.  Baseline around 1.1.  Cr improved with IVF.  Hypokalemia  Repleted PRN  # Failure to thrive # Poor oral intake and weight loss # Severe malnutrition in context of chronic illness Per patient PCP, patient has been losing weight.  Nursing noted poor oral intake.  [facility].  Palliative care consulted.  Per palliative conversation with pt's PCP Dr. Lavone Neri, "Patient has a MOST form. This form indicates patient would NOT want a feeding tube."  After discharge, Dr. Lavone Neri will resume care at [facility] and likely proceed with transition to comfort focused measures at [facility].   Hypoglycemia 2/2 poor oral intake Per PCP, this has been a chronic problem.  Pt does not want artifical feeding.  Pt was asymptomatic with hypoglycemic episodes during hospitalization.  Pt was offered food and drinks by nursing, and would spit out foods, but would take sips of fluids.  Baseline dementia Anxiety Continued proazc and trazodone.  Seroquel  changed to 25 mg nightly.  Obstructive sleep apnea. Continued CPAP while sleeping  Thrombocytopenia and anemia. not def in iron or Vit B12, but def in folate (2.6).  Folate suppl started.  History of  DVT. Reviewed all previous charts, looks like a he had a provoked DVT more than a year ago, Eliquis has completed, changed anticoagulation to prophylactic Lovenox.  Right bundle branch block.   Per patient's PCP Dr. Lavone Neri, patient has been deemed to be incompetent to make his own decisions. He has significant dementia and memory issues. The wife also has some memory issue, but currently is Media planner. Patient has been falling at home, will need nursing home placement for rehab.  Per Palliative care note: Incomplete/undated MOST form was rec'd from [facility], but only the front page, so it cannot be scanned into our medical records. It is signed by a provider (Dakar Howell, AGNP-C) and by his wife Brett Lam), effective 03/27/20.   These are the elections made:  Section A: DNR Section B: Comfort Measures. Keep clean, warm and dry. Use medication by any route , positioning, wound care, and other measures to relive pain and suffering. Use O2, suction, & manual treatment of airway obstruction as needed for comfort. Do not transfer to hospital unless comfort needs cannot be met in current location.  Section C: Antibiotics if indicated. Section D: IV fluids if indicated. No feeding tube Section E: Discussed with spouse.   Discharge Diagnoses:  Principal Problem:   AKI (acute kidney injury) ([place]) Active Problems:   Sleep apnea   HTN (hypertension)   Hyperlipidemia   Essential (primary) hypertension  Hypokalemia   COVID-19 virus infection   Pressure injury of skin   Protein-calorie malnutrition, severe   Failure to thrive in adult   Goals of care, counseling/discussion   Palliative care by specialist   DNR (do not resuscitate)    Discharge Instructions:  Allergies as of  04/02/2020      Reactions   Chlorpheniramine       Medication List    STOP taking these medications   amLODipine 5 MG tablet Commonly known as: NORVASC   apixaban Kit Commonly known as: ELIQUIS   atorvastatin 40 MG tablet Commonly known as: LIPITOR   Caltrate 600+D Plus Minerals 600-800 MG-UNIT Tabs   Cholecalciferol 25 MCG (1000 UT) capsule   docusate sodium 100 MG capsule Commonly known as: Colace   donepezil 10 MG tablet Commonly known as: ARICEPT   doxazosin 4 MG tablet Commonly known as: CARDURA   Eliquis DVT/PE Starter Pack 5 MG Tabs   finasteride 5 MG tablet Commonly known as: PROSCAR   fluticasone 50 MCG/ACT nasal spray Commonly known as: FLONASE   furosemide 20 MG tablet Commonly known as: LASIX   ibuprofen 800 MG tablet Commonly known as: ADVIL   lisinopril 10 MG tablet Commonly known as: ZESTRIL   lisinopril-hydrochlorothiazide 20-12.5 MG tablet Commonly known as: ZESTORETIC   lovastatin 20 MG tablet Commonly known as: MEVACOR   oxyCODONE-acetaminophen 5-325 MG tablet Commonly known as: Percocet   potassium chloride 20 MEQ/15ML (10%) Soln   potassium chloride SA 20 MEQ tablet Commonly known as: KLOR-CON   prednisoLONE acetate 1 % ophthalmic suspension Commonly known as: PRED FORTE   sertraline 100 MG tablet Commonly known as: ZOLOFT   spironolactone 25 MG tablet Commonly known as: ALDACTONE     TAKE these medications   acetaminophen 650 MG CR tablet Commonly known as: TYLENOL Take 650 mg by mouth every 12 (twelve) hours as needed for pain.   feeding supplement Liqd Take 237 mLs by mouth 3 (three) times daily between meals.   FLUoxetine 20 MG capsule Commonly known as: PROZAC Take 20 mg by mouth daily.   folic acid 1 MG tablet Commonly known as: FOLVITE Take 1 tablet (1 mg total) by mouth daily. Start taking on: April 03, 2020   mirtazapine 7.5 MG tablet Commonly known as: REMERON Take 7.5 mg by mouth daily at 6  (six) AM.   MULTIVITAMIN & MINERAL PO Take by mouth.   QUEtiapine 25 MG tablet Commonly known as: SEROQUEL Take 1 tablet (25 mg total) by mouth at bedtime. What changed:   how much to take  when to take this  additional instructions   tamsulosin 0.4 MG Caps capsule Commonly known as: FLOMAX Take 0.8 mg by mouth every morning.   traZODone 50 MG tablet Commonly known as: DESYREL Take 50 mg by mouth at bedtime.        Contact information for follow-up providers    Schedule an appointment as soon as possible for a visit  with Leticia Penna, MD.   Specialty: Family Medicine Contact information: [place] [address] [phone]            Contact information for after-discharge care    Destination    [facility].   Service: Skilled Nursing Contact information: [address]  [place] [phone]                  Allergies  Allergen Reactions  . Chlorpheniramine  The results of significant diagnostics from this hospitalization (including imaging, microbiology, ancillary and laboratory) are listed below for reference.   Consultations:   Procedures/Studies: DG Chest 2 View  Result Date: 03/25/2020 CLINICAL DATA:  Shortness of breath.  COVID positive. EXAM: CHEST - 2 VIEW COMPARISON:  01/19/2020 FINDINGS: There are subtle bilateral hazy airspace opacities with prominent interstitial lung markings. There is no pneumothorax. The cardiomediastinal silhouette is unremarkable. There are trace bilateral pleural effusions. IMPRESSION: Findings consistent with an atypical infectious process. Trace bilateral pleural effusions. Electronically Signed   By: Constance Holster M.D.   On: 03/25/2020 20:55      Labs: BNP (last 3 results) No results for input(s): BNP in the last 8760 hours. Basic Metabolic Panel: Recent Labs  Lab 03/27/20 0444 03/29/20 0602 03/31/20 0754  NA 139  143 145  K 3.4* 3.7 3.4*  CL 115* 120* 117*  CO2 18* 21* 22  GLUCOSE 107* 56* 64*  BUN 39* 37* 39*  CREATININE 1.61* 1.46* 1.30*  CALCIUM 7.2* 7.7* 7.6*  MG 1.8 1.8 1.9   Liver Function Tests: No results for input(s): AST, ALT, ALKPHOS, BILITOT, PROT, ALBUMIN in the last 168 hours. No results for input(s): LIPASE, AMYLASE in the last 168 hours. No results for input(s): AMMONIA in the last 168 hours. CBC: Recent Labs  Lab 03/27/20 0444 03/29/20 0602 03/31/20 0754  WBC 4.3 4.2 4.6  NEUTROABS 3.4  --   --   HGB 9.5* 9.3* 9.3*  HCT 26.9* 27.6* 27.3*  MCV 96.4 100.7* 100.0  PLT 94* 87* 107*   Cardiac Enzymes: No results for input(s): CKTOTAL, CKMB, CKMBINDEX, TROPONINI in the last 168 hours. BNP: Invalid input(s): POCBNP CBG: Recent Labs  Lab 03/31/20 0940 03/31/20 1004 03/31/20 2041 03/31/20 2234  GLUCAP 58* 72 64* 90   D-Dimer No results for input(s): DDIMER in the last 72 hours. Hgb A1c No results for input(s): HGBA1C in the last 72 hours. Lipid Profile No results for input(s): CHOL, HDL, LDLCALC, TRIG, CHOLHDL, LDLDIRECT in the last 72 hours. Thyroid function studies No results for input(s): TSH, T4TOTAL, T3FREE, THYROIDAB in the last 72 hours.  Invalid input(s): FREET3 Anemia work up No results for input(s): VITAMINB12, FOLATE, FERRITIN, TIBC, IRON, RETICCTPCT in the last 72 hours. Urinalysis    Component Value Date/Time   COLORURINE YELLOW (A) 03/25/2020 2157   APPEARANCEUR HAZY (A) 03/25/2020 2157   APPEARANCEUR Clear 12/01/2014 1129   LABSPEC 1.011 03/25/2020 2157   PHURINE 5.0 03/25/2020 2157   GLUCOSEU NEGATIVE 03/25/2020 2157   HGBUR NEGATIVE 03/25/2020 2157   BILIRUBINUR NEGATIVE 03/25/2020 2157   BILIRUBINUR Negative 12/01/2014 [place] 03/25/2020 2157   PROTEINUR NEGATIVE 03/25/2020 2157   UROBILINOGEN 0.2 02/09/2008 1639   NITRITE NEGATIVE 03/25/2020 2157   LEUKOCYTESUR NEGATIVE 03/25/2020 2157   Sepsis Labs Invalid  input(s): PROCALCITONIN,  WBC,  LACTICIDVEN Microbiology No results found for this or any previous visit (from the past 240 hour(s)).   Total time spend on discharging this patient, including the last patient exam, discussing the hospital stay, instructions for ongoing care as it relates to all pertinent caregivers, as well as preparing the medical discharge records, prescriptions, and/or referrals as applicable, is 30 minutes.    Enzo Bi, MD  [facility] 04/02/2020, 10:56 AM

## 2020-04-18 ENCOUNTER — Ambulatory Visit: Payer: Medicare (Managed Care) | Admitting: Gastroenterology

## 2020-04-18 ENCOUNTER — Telehealth: Payer: Self-pay

## 2020-04-18 NOTE — Telephone Encounter (Signed)
Called Medical records and informed them that patient has died and she is going to mark the chart

## 2020-04-18 NOTE — Telephone Encounter (Signed)
Patient caregiver called to inform us that the patient passed away. Canceled his appointment today. He died on April 25, 2020

## 2020-04-18 DEATH — deceased
